# Patient Record
Sex: Female | Born: 1980 | Race: White | Hispanic: No | Marital: Married | Smoking: Former smoker
Health system: Southern US, Community
[De-identification: ages and names within clinical notes are randomized; demographics above are authoritative.]

---
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
## 2006-07-17 HISTORY — PX: DILATION AND CURETTAGE OF UTERUS: SHX78

## 2020-05-25 ENCOUNTER — Ambulatory Visit: Payer: Self-pay | Attending: Internal Medicine

## 2020-05-25 DIAGNOSIS — Z23 Encounter for immunization: Secondary | ICD-10-CM

## 2020-05-25 NOTE — Progress Notes (Signed)
   Covid-19 Vaccination Clinic  Name:  April Juarez    MRN: [id number] DOB: 1981-03-04  05/25/2020  Ms. April Juarez was observed post Covid-19 immunization for 30 minutes based on pre-vaccination screening without incident. She was provided with Vaccine Information Sheet and instruction to access the V-Safe system.   Ms. April Juarez was instructed to call 911 with any severe reactions post vaccine: Marland Kitchen Difficulty breathing  . Swelling of face and throat  . A fast heartbeat  . A bad rash all over body  . Dizziness and weakness

## 2020-07-30 ENCOUNTER — Other Ambulatory Visit: Payer: Self-pay

## 2020-07-30 ENCOUNTER — Encounter: Payer: Self-pay | Admitting: Orthopedic Surgery

## 2020-07-30 ENCOUNTER — Ambulatory Visit (INDEPENDENT_AMBULATORY_CARE_PROVIDER_SITE_OTHER): Payer: 59 | Admitting: Orthopedic Surgery

## 2020-07-30 VITALS — BP 122/78 | HR 71 | Temp 97.3°F | Ht 66.5 in | Wt 154.4 lb

## 2020-07-30 DIAGNOSIS — J302 Other seasonal allergic rhinitis: Secondary | ICD-10-CM | POA: Diagnosis not present

## 2020-07-30 DIAGNOSIS — Z0000 Encounter for general adult medical examination without abnormal findings: Secondary | ICD-10-CM

## 2020-07-30 DIAGNOSIS — G4709 Other insomnia: Secondary | ICD-10-CM | POA: Diagnosis not present

## 2020-07-30 DIAGNOSIS — L299 Pruritus, unspecified: Secondary | ICD-10-CM

## 2020-07-30 DIAGNOSIS — L700 Acne vulgaris: Secondary | ICD-10-CM

## 2020-07-30 DIAGNOSIS — Z6824 Body mass index (BMI) 24.0-24.9, adult: Secondary | ICD-10-CM

## 2020-07-30 NOTE — Progress Notes (Addendum)
Careteam: No care team member to display  Seen by: Windell Moulding, AGNP-C  PLACE OF SERVICE:  [place]  Advanced Directive information    Allergies  Allergen Reactions  . Sulfa Antibiotics Hives    Chief Complaint  April Juarez presents with  . Establish Care    Np to Establish Care. Complains of Acne and bilateral ears itching.     HPI: April Juarez is a 40 y.o. female seen today to establish care at [facility].   Moved to [place] from [place], [place] about 6 months ago. She is a Equities trader at [employer], womens unit. She is married with 4 children.   Ear itching- began a few years ago. Itching only in the canal part of ear. Denies ear drainage or pain. Denies frequent ear infections. Has seen a dermatologist in th past. Dermatology told her they did not know cause. Takes Claritin to help with itch. Also has prescribed clobatesol  steroid cream, only uses when itching is severe. In addition, she uses Benadryl often to help with ears and sleep. Tried Xyzal in past and it made her very drowsy.   Seasonal allergies- states allergies to pollen, molds, and ragweed. Takes Claritin and Benadryl.   Cyctic acne- started 8 years ago after childbirth. 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 Thinks it may be related to food triggers, especially dairy. Acne improves when limiting dairy proucts in her diet. Use to take birth control, but does not wish to take anymore. Uses daily moisturizer, does not know if oil free. Washes face twice daily. Does not wear makeup excepet on eyes. Has tried products with salicylic acid in past, but was too drying on the skin.   Uses ibuprofen for mild back pain or headache. Uses may be 1-3 times monthly.   Periods- still having them. Less regular, about 24-32 days. Lasts 5-7 days. Some bloating and cramps. Has not used birthcontrol in a few years.  PAP smear done last year, negative.  Requesting referral for gynecologist.   Diet- states her diet is high in fruits and vegetables. Eats lean protein. Tries to avoid sugar. Has been cutting calories in an effort to lose weight.   Exercise- does yoga or bar exercises about 3 times a week. 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   Nonsmoker. Quit 15 years ago. Use to smoke 1 pack/day.   Alcohol- drinks about 2-3 drinks a week. Either beer or wine.   No drug use.   No recent hospitalizations or injures.   Received pfizer covid vaccine in early 2021, Moderna booster 05/2020. Flu vaccine 04/2020. 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   Denies depression. States she is a normally anxious person. Denies panic attacks.     Review of Systems:  Review of Systems  Constitutional: Negative for chills, fever, malaise/fatigue and weight loss.  HENT: Positive for nosebleeds. Negative for ear discharge, ear pain, sinus pain and sore throat.        Ear itching  Eyes: Negative for blurred vision and photophobia.  Respiratory: Negative for cough, shortness of breath and wheezing.   Cardiovascular: Negative for chest pain and leg swelling.  Gastrointestinal: Negative for abdominal pain, constipation, heartburn, nausea and vomiting.  Genitourinary: Negative for dysuria, frequency and hematuria.  Musculoskeletal: Positive for back pain. Negative for joint pain and myalgias.  Skin:  Cystic acne  Neurological: Negative for dizziness, weakness and headaches.  Endo/Heme/Allergies: Negative for polydipsia.  Psychiatric/Behavioral: Negative for depression. The April Juarez has insomnia. The April Juarez is not nervous/anxious.     No past medical history on file. Past Surgical History:  Procedure Laterality Date  . DILATION AND CURETTAGE OF UTERUS  2008  . TONSILLECTOMY AND ADENOIDECTOMY  2000   Social History:   reports that she has quit smoking. She has a 3.00 pack-year smoking  history. She has never used smokeless tobacco. She reports current alcohol use. She reports that she does not use drugs.  Family History  Problem Relation Age of Onset  . Macular degeneration Mother   . Dementia Maternal Grandmother   . Cancer Maternal Grandmother   . Stroke Maternal Grandfather   . Heart attack Maternal Grandfather   . Cancer Paternal Grandmother   . Stroke Paternal Grandmother   . Stroke Paternal Grandfather   . Heart attack Paternal Grandfather     Medications: April Juarez's Medications  New Prescriptions   No medications on file  Previous Medications   IBUPROFEN PO    Take by mouth. 600-800 mg 1 to 2 times a week   MULTIPLE VITAMINS-MINERALS (WOMENS MULTIVITAMIN PO)    Take by mouth daily.   OMEGA-3 FATTY ACIDS (FISH OIL PO)    Take by mouth daily.   VITAMIN D, CHOLECALCIFEROL, PO    Take by mouth.  Modified Medications   No medications on file  Discontinued Medications   No medications on file    Physical Exam:  Vitals:   07/30/20 1339  BP: 122/78  Pulse: 71  Temp: (!) 97.3 F (36.3 C)  TempSrc: Skin  SpO2: 98%  Weight: 154 lb 6.4 oz (70 kg)  Height: 5' 6.5" (1.689 m)   Body mass index is 24.55 kg/m. Wt Readings from Last 3 Encounters:  07/30/20 154 lb 6.4 oz (70 kg)    Physical Exam Vitals reviewed.  Constitutional:      General: She is not in acute distress.    Appearance: Normal appearance.  HENT:     Head: Normocephalic.     Right Ear: Tympanic membrane and ear canal normal. There is no impacted cerumen.     Left Ear: Tympanic membrane and ear canal normal. There is no impacted cerumen.     Mouth/Throat:     Pharynx: No oropharyngeal exudate.  Eyes:     General:        Right eye: No discharge.        Left eye: No discharge.     Conjunctiva/sclera: Conjunctivae normal.     Pupils: Pupils are equal, round, and reactive to light.  Neck:     Thyroid: No thyroid mass, thyromegaly or thyroid tenderness.  Cardiovascular:     Rate and  Rhythm: Normal rate and regular rhythm.     Pulses: Normal pulses.     Heart sounds: Normal heart sounds.  Pulmonary:     Effort: Pulmonary effort is normal.     Breath sounds: Normal breath sounds.  Abdominal:     General: Abdomen is flat. Bowel sounds are normal. There is no distension.     Palpations: Abdomen is soft.     Tenderness: There is no abdominal tenderness.  Musculoskeletal:        General: Normal range of motion.     Cervical back: Normal range of motion.     Right lower leg: No edema.     Left lower leg: No  edema.  Lymphadenopathy:     Cervical: No cervical adenopathy.  Skin:    General: Skin is warm and dry.     Capillary Refill: Capillary refill takes less than 2 seconds.       Neurological:     Mental Status: She is alert and oriented to person, place, and time.     Cranial Nerves: No cranial nerve deficit.     Sensory: No sensory deficit.     Motor: No weakness.     Coordination: Coordination normal.     Gait: Gait normal.     Deep Tendon Reflexes: Reflexes normal.  Psychiatric:        Mood and Affect: Mood normal.        Behavior: Behavior normal.     Labs reviewed: Basic Metabolic Panel: No results for input(s): NA, K, CL, CO2, GLUCOSE, BUN, CREATININE, CALCIUM, MG, PHOS, TSH in the last 8760 hours. Liver Function Tests: No results for input(s): AST, ALT, ALKPHOS, BILITOT, PROT, ALBUMIN in the last 8760 hours. No results for input(s): LIPASE, AMYLASE in the last 8760 hours. No results for input(s): AMMONIA in the last 8760 hours. CBC: No results for input(s): WBC, NEUTROABS, HGB, HCT, MCV, PLT in the last 8760 hours. Lipid Panel: No results for input(s): CHOL, HDL, LDLCALC, TRIG, CHOLHDL, LDLDIRECT in the last 8760 hours. TSH: No results for input(s): TSH in the last 8760 hours. A1C: No results found for: HGBA1C   Assessment/Plan 1. Annual physical exam - physical exam unremarkable - CBC with Differential/Platelets; Future - CMP; Future -  Lipid Panel; Future - Ambulatory referral to Gynecology  2. Cystic acne - comedones present around mouth and chin area, skin slightly oily - recommend oil free skin products - recommend trying retinol since salicylic acid was too drying - Ambulatory referral to Dermatology  3. Seasonal allergies - stable at this time, continue Clairtin  4. Itching of ear - ear canal no signs of trauma, erythema, dryness - recommend trying a small amount of cerave lightly in ear canal twice weekly for 2 weeks, continue if helping - recommend trying zyrtec for 2 weeks to see if itching reduces - continue Claritin if Zyrtec unsuccessful - advised to stop taking benadryl daily unless itching is severe  5. Other insomnia - uses benadryl often - advised to try melatonin or lavender essential oils to help sleep - advised to stop using electronic devices 1 hour prior to sleep  6. BMI 24.0-24.9, adult - healthy BMI range - continue to watch calories - continue current exercise regimen   I provided 41 minutes of face-to-face time during this encounter.    Next appt: Visit date not found Hargun Spurling Scherry Ran  [facility] [phone]

## 2020-07-30 NOTE — Patient Instructions (Signed)
Make sure skin products oil free.  Recommend Cerave retinol serum at night (purple label- buy at [employer]) May try Cerave cream in ears 2 times a week May try zyrtec twice a day for ear itching Referral to dermatologist and gyno made      Acne  Acne is a skin problem that causes small, red bumps (pimples) and other skin changes. The skin has tiny holes called pores. Each pore has an oil gland. Acne happens when the pores get blocked. The pores may become red, sore, and swollen. They may also become infected. Acne is common among teenagers. Acne usually goes away over time. What are the causes? This condition may be caused when:  Oil glands get blocked by oil, dead skin cells, and dirt.  Bacteria that live in the oil glands increase in number and cause infection. Acne can start with changes in hormones. These changes can occur:  When children mature into their teens (adolescence).  When women get their period (menstrual cycle).  When women are pregnant. Some things can make acne worse. They include:  Cosmetics and hair products that have oil in them.  Stress.  Diseases that cause changes in hormones.  Some medicines.  Headbands, backpacks, or shoulder pads.  Being near certain oils and chemicals.  Foods that are high in sugars. These include dairy products, sweets, and chocolates. What increases the risk? You are more likely to develop this condition if:  You are a teenager.  You have a family history of acne. What are the signs or symptoms? Symptoms of this condition include:  Small, red bumps (pimples or papules).  Whiteheads.  Blackheads.  Small, pus-filled pimples (pustules).  Big, red pimples or pustules that feel tender. Acne that is very bad can cause:  An abscess. This is an area that has pus.  Cysts. These are hard, painful sacs that have fluid.  Scars. These can happen after large pimples heal. How is this treated? Treatment for this condition  depends on how bad your acne is. It may include:  Creams and lotions. These can: ? Keep the pores of your skin open. ? Prevent infections and swelling.  Medicines that treat infections (antibiotics). These can be put on your skin or taken as pills.  Pills that decrease the amount of oil in your skin.  Birth control pills.  Light or laser treatments.  Shots of medicine into the areas with acne.  Chemicals that cause the skin to peel.  Surgery. Follow these instructions at home: Good skin care is the most important thing you can do to treat your acne. Take care of your skin as told by your doctor. You may be told to do these things:  Wash your skin gently at least two times each day. You should also wash your skin: ? After you exercise. ? Before you go to bed.  Use mild soap.  Use a water-based skin moisturizer after you wash your skin.  Use a sunscreen or sunblock with SPF 30 or greater. This is very important if you are using acne medicines.  Choose cosmetics that will not block your oil glands (are noncomedogenic). Medicines  Take over-the-counter and prescription medicines only as told by your doctor.  If you were prescribed an antibiotic medicine, use it or take it as told by your doctor. Do not stop using the antibiotic even if your acne gets better. General instructions  Keep your hair clean and off your face. Shampoo your hair on a regular basis.  If you have oily hair, you may need to wash it every day.  Avoid wearing tight headbands or hats.  Avoid picking or squeezing your pimples. That can make your acne worse and cause it to scar.  Shave gently. Only shave when you have to.  Keep a food journal. This can help you see if any foods are linked to your acne.  Keep all follow-up visits as told by your doctor. This is important. Contact a doctor if:  Your acne is not better after eight weeks.  Your acne gets worse.  You have a large area of skin that is  red or tender.  You think that you are having side effects from any acne medicine. Summary  Acne is a skin problem that causes pimples. Acne is common among teenagers. Acne usually goes away over time.  Acne starts with changes in your hormones. Other causes include stress, diet, and some medicines.  Follow your doctor's instructions on how to take care of your skin. Good skin care is the most important thing you can do to treat your acne.  Take over-the-counter and prescription medicines only as told by your doctor.  Contact your doctor if you think that you are having side effects from any acne medicine. This information is not intended to replace advice given to you by your health care provider. Make sure you discuss any questions you have with your health care provider. Document Revised: 11/13/2017 Document Reviewed: 11/13/2017 Elsevier Patient Education  [place].

## 2020-08-01 DIAGNOSIS — L299 Pruritus, unspecified: Secondary | ICD-10-CM | POA: Insufficient documentation

## 2020-08-01 DIAGNOSIS — Z6824 Body mass index (BMI) 24.0-24.9, adult: Secondary | ICD-10-CM | POA: Insufficient documentation

## 2020-08-01 DIAGNOSIS — L700 Acne vulgaris: Secondary | ICD-10-CM | POA: Insufficient documentation

## 2020-08-01 DIAGNOSIS — J302 Other seasonal allergic rhinitis: Secondary | ICD-10-CM | POA: Insufficient documentation

## 2020-08-04 NOTE — Addendum Note (Signed)
Addended byWindell Moulding E on: 08/04/2020 08:15 PM   Modules accepted: Level of Service

## 2020-08-13 ENCOUNTER — Other Ambulatory Visit: Payer: 59

## 2020-08-13 ENCOUNTER — Other Ambulatory Visit: Payer: Self-pay

## 2020-08-13 DIAGNOSIS — Z0000 Encounter for general adult medical examination without abnormal findings: Secondary | ICD-10-CM | POA: Diagnosis not present

## 2020-08-14 LAB — CBC WITH DIFFERENTIAL/PLATELET
Absolute Monocytes: 498 cells/uL (ref 200–950)
Basophils Absolute: 42 cells/uL (ref 0–200)
Basophils Relative: 0.7 %
Eosinophils Absolute: 162 cells/uL (ref 15–500)
Eosinophils Relative: 2.7 %
HCT: 37.7 % (ref 35.0–45.0)
Hemoglobin: 12.5 g/dL (ref 11.7–15.5)
Lymphs Abs: 1608 cells/uL (ref 850–3900)
MCH: 29.8 pg (ref 27.0–33.0)
MCHC: 33.2 g/dL (ref 32.0–36.0)
MCV: 89.8 fL (ref 80.0–100.0)
MPV: 12.3 fL (ref 7.5–12.5)
Monocytes Relative: 8.3 %
Neutro Abs: 3690 cells/uL (ref 1500–7800)
Neutrophils Relative %: 61.5 %
Platelets: 223 10*3/uL (ref 140–400)
RBC: 4.2 10*6/uL (ref 3.80–5.10)
RDW: 13.1 % (ref 11.0–15.0)
Total Lymphocyte: 26.8 %
WBC: 6 10*3/uL (ref 3.8–10.8)

## 2020-08-14 LAB — COMPREHENSIVE METABOLIC PANEL
AG Ratio: 2 (calc) (ref 1.0–2.5)
ALT: 11 U/L (ref 6–29)
AST: 14 U/L (ref 10–30)
Albumin: 4.4 g/dL (ref 3.6–5.1)
Alkaline phosphatase (APISO): 33 U/L (ref 31–125)
BUN: 14 mg/dL (ref 7–25)
CO2: 27 mmol/L (ref 20–32)
Calcium: 9.3 mg/dL (ref 8.6–10.2)
Chloride: 106 mmol/L (ref 98–110)
Creat: 0.85 mg/dL (ref 0.50–1.10)
Globulin: 2.2 g/dL (calc) (ref 1.9–3.7)
Glucose, Bld: 85 mg/dL (ref 65–99)
Potassium: 3.9 mmol/L (ref 3.5–5.3)
Sodium: 140 mmol/L (ref 135–146)
Total Bilirubin: 0.5 mg/dL (ref 0.2–1.2)
Total Protein: 6.6 g/dL (ref 6.1–8.1)

## 2020-08-14 LAB — LIPID PANEL
Cholesterol: 115 mg/dL (ref <200)
HDL: 39 mg/dL — ABNORMAL LOW (ref >=50)
LDL Cholesterol (Calc): 64 mg/dL (calc)
Non-HDL Cholesterol (Calc): 76 mg/dL (calc) (ref <130)
Total CHOL/HDL Ratio: 2.9 (calc) (ref <5.0)
Triglycerides: 50 mg/dL (ref <150)

## 2020-09-10 ENCOUNTER — Other Ambulatory Visit: Payer: Self-pay | Admitting: Orthopedic Surgery

## 2020-09-10 ENCOUNTER — Encounter: Payer: Self-pay | Admitting: Orthopedic Surgery

## 2020-09-10 DIAGNOSIS — L700 Acne vulgaris: Secondary | ICD-10-CM

## 2020-09-10 MED ORDER — CLINDAMYCIN PHOSPHATE 1 % EX GEL: Freq: Two times a day (BID) | CUTANEOUS | 0 refills | Status: DC

## 2020-09-13 ENCOUNTER — Telehealth: Payer: Self-pay | Admitting: *Deleted

## 2020-09-13 NOTE — Telephone Encounter (Signed)
Received Prior Authorization for patients' Clindamycin Phosphate 1% Gel.  Prior authorization done through CoverMyMeds.  [employer] is reviewing and determination in 48-72 hours.  Key: [id number] PA Case ID: [id number] Rx #: [id number]

## 2020-09-16 NOTE — Telephone Encounter (Signed)
Received fax from [facility] for Clindamycin was DENIED.  Stated that patient did not meet step therapy rule for the requested drug. Standard step therapy requires that patient have tried preferred options before receiving coverage for this drug.  Approval requires patient to try: generic Cleocin-T 1% gel.   Denial Paperwork placed in Amy Fargo's folder to review.

## 2020-09-17 ENCOUNTER — Encounter: Payer: Self-pay | Admitting: Orthopedic Surgery

## 2020-09-17 ENCOUNTER — Other Ambulatory Visit: Payer: Self-pay | Admitting: Orthopedic Surgery

## 2020-09-17 DIAGNOSIS — L700 Acne vulgaris: Secondary | ICD-10-CM

## 2020-09-17 MED ORDER — CLINDAMYCIN PHOSPHATE 1 % EX LOTN: TOPICAL_LOTION | Freq: Two times a day (BID) | CUTANEOUS | 0 refills | Status: DC

## 2020-12-08 ENCOUNTER — Encounter: Payer: Self-pay | Admitting: Orthopedic Surgery

## 2020-12-30 ENCOUNTER — Ambulatory Visit: Payer: Medicaid Other | Admitting: Physician Assistant

## 2021-01-06 ENCOUNTER — Encounter: Payer: Self-pay | Admitting: Orthopedic Surgery

## 2021-01-06 ENCOUNTER — Ambulatory Visit (INDEPENDENT_AMBULATORY_CARE_PROVIDER_SITE_OTHER): Payer: 59 | Admitting: Orthopedic Surgery

## 2021-01-06 ENCOUNTER — Other Ambulatory Visit: Payer: Self-pay

## 2021-01-06 VITALS — BP 118/64 | HR 66 | Temp 97.1°F | Ht 66.5 in | Wt 146.4 lb

## 2021-01-06 DIAGNOSIS — F419 Anxiety disorder, unspecified: Secondary | ICD-10-CM

## 2021-01-06 DIAGNOSIS — F321 Major depressive disorder, single episode, moderate: Secondary | ICD-10-CM | POA: Diagnosis not present

## 2021-01-06 DIAGNOSIS — L853 Xerosis cutis: Secondary | ICD-10-CM

## 2021-01-06 MED ORDER — TRIAMCINOLONE ACETONIDE 0.1 % EX CREA: 1.0000 "application " | TOPICAL_CREAM | Freq: Two times a day (BID) | CUTANEOUS | 0 refills | Status: DC

## 2021-01-06 MED ORDER — BUPROPION HCL ER (XL) 150 MG PO TB24: 150.0000 mg | ORAL_TABLET | Freq: Every day | ORAL | 3 refills | Status: DC

## 2021-01-06 NOTE — Patient Instructions (Signed)
Dr. Johney Maine - daily alpha-beta wipes/ [facility]

## 2021-01-06 NOTE — Progress Notes (Signed)
Careteam: Patient Care Team: Yvonna Alanis, NP as PCP - General (Adult Health Nurse Practitioner)  Seen by: Windell Moulding, AGNP-C  PLACE OF SERVICE:  [place] Directive information    Allergies  Allergen Reactions   Sulfa Antibiotics Hives    Chief Complaint  Patient presents with   Acute Visit    Patient presents today for possible anxiety and depression for a while but seems to have progressed over the last 3-4 months. She reports been on antidepressant in high school.     HPI: Patient is a 40 y.o. female seen today for acute visit for depression.   She has dealt with depression on/off for years. In the past year, she has had many changes like moving to a new town and starting a new job. 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 Started seeing a therapist a few months ago. She has been told she has a combination of anxiety with depression. Her main complaint is trying to focus, she feels her increased anxiety has made it hard for her to study. Sleeping well. Denies plan to harm self.   As a teenager she was on prozac and zoloft. She did well with zoloft. Asking to try Wellbutrin today per therapist recommendation.     Review of Systems:  Review of Systems  Constitutional:  Negative for chills, fever, malaise/fatigue and weight loss.  Respiratory:  Negative for cough, shortness of breath and wheezing.   Cardiovascular:  Negative for chest pain, palpitations and leg swelling.  Psychiatric/Behavioral:  Positive for depression. Negative for suicidal ideas. The patient is nervous/anxious. The patient does not have insomnia.    History reviewed. No pertinent past medical history. Past Surgical History:  Procedure Laterality Date   DILATION AND CURETTAGE OF UTERUS  2008   TONSILLECTOMY AND ADENOIDECTOMY  2000   Social History:   reports that she has quit smoking. She has a 3.00 pack-year smoking  history. She has never used smokeless tobacco. She reports current alcohol use. She reports that she does not use drugs.  Family History  Problem Relation Age of Onset   Macular degeneration Mother    Dementia Maternal Grandmother    Cancer Maternal Grandmother    Stroke Maternal Grandfather    Heart attack Maternal Grandfather    Cancer Paternal Grandmother    Stroke Paternal Grandmother    Stroke Paternal Grandfather    Heart attack Paternal Grandfather     Medications: Patient's Medications  New Prescriptions   No medications on file  Previous Medications   CLINDAMYCIN (CLEOCIN-T) 1 % LOTION    Apply topically 2 (two) times daily. Apply to cystic acne   IBUPROFEN PO    Take by mouth. 600-800 mg 1 to 2 times a week   MULTIPLE VITAMINS-MINERALS (WOMENS MULTIVITAMIN PO)    Take by mouth daily.   OMEGA-3 FATTY ACIDS (FISH OIL PO)    Take by mouth daily.   VITAMIN D, CHOLECALCIFEROL, PO    Take by mouth.  Modified Medications   No medications on file  Discontinued Medications   CLINDAMYCIN (CLINDAGEL) 1 % GEL    Apply topically 2 (two) times daily. Apply to cystic ance    Physical Exam:  Vitals:   01/06/21 0937  BP: 118/64  Pulse: 66  Temp: (!) 97.1 F (36.2 C)  TempSrc: Temporal  SpO2: 98%  Weight: 146 lb 6.4 oz (66.4 kg)  Height: 5' 6.5" (1.689 m)   Body mass index is 23.28 kg/m. Wt Readings from Last 3 Encounters:  01/06/21 146 lb 6.4 oz (66.4 kg)  07/30/20 154 lb 6.4 oz (70 kg)    Physical Exam Vitals reviewed.  Constitutional:      Appearance: Normal appearance.  HENT:     Head: Normocephalic.     Ears:     Comments: Dry flaking skin behind ears.  Cardiovascular:     Rate and Rhythm: Normal rate and regular rhythm.     Pulses: Normal pulses.     Heart sounds: Normal heart sounds.  Pulmonary:     Effort: Pulmonary effort is normal.     Breath sounds: Normal breath sounds.  Skin:    General: Skin is warm and dry.     Capillary Refill: Capillary  refill takes less than 2 seconds.  Neurological:     General: No focal deficit present.     Mental Status: She is alert and oriented to person, place, and time.  Psychiatric:        Mood and Affect: Mood normal.        Behavior: Behavior normal.    Labs reviewed: Basic Metabolic Panel: Recent Labs    08/13/20 0923  NA 140  K 3.9  CL 106  CO2 27  GLUCOSE 85  BUN 14  CREATININE 0.85  CALCIUM 9.3   Liver Function Tests: Recent Labs    08/13/20 0923  AST 14  ALT 11  BILITOT 0.5  PROT 6.6   No results for input(s): LIPASE, AMYLASE in the last 8760 hours. No results for input(s): AMMONIA in the last 8760 hours. CBC: Recent Labs    08/13/20 0923  WBC 6.0  NEUTROABS 3,690  HGB 12.5  HCT 37.7  MCV 89.8  PLT 223   Lipid Panel: Recent Labs    08/13/20 0923  CHOL 115  HDL 39*  LDLCALC 64  TRIG 50  CHOLHDL 2.9   TSH: No results for input(s): TSH in the last 8760 hours. A1C: No results found for: HGBA1C   Assessment/Plan 1. Depression, major, single episode, moderate (HCC) - history of depression as teenager - she has had many changes within past year, she is having trouble dealing with change - cont therapy visits - will consider Zoloft if she does not like Wellbutrin - buPROPion (WELLBUTRIN XL) 150 MG 24 hr tablet; Take 1 tablet (150 mg total) by mouth daily.  Dispense: 30 tablet; Refill: 3  2. Dry skin -dry skin behind ears - triamcinolone cream (KENALOG) 0.1 %; Apply 1 application topically 2 (two) times daily.  Dispense: 30 g; Refill: 0  3. Anxiety - suspect due to stressful work life and starting school - will try wellbutrin, recommend restarting zoloft if she does not tolerate medication  Total time: 25 minutes. Greater than 50% of total time spent doing patient education and medication management.   Next appt: none Gillian Meeuwsen [place], [facility] [phone]

## 2021-03-09 ENCOUNTER — Encounter: Payer: Self-pay | Admitting: Orthopedic Surgery

## 2021-03-10 ENCOUNTER — Other Ambulatory Visit: Payer: Self-pay | Admitting: Orthopedic Surgery

## 2021-03-10 DIAGNOSIS — L700 Acne vulgaris: Secondary | ICD-10-CM

## 2021-03-10 DIAGNOSIS — L853 Xerosis cutis: Secondary | ICD-10-CM

## 2021-04-06 ENCOUNTER — Telehealth: Payer: Self-pay | Admitting: *Deleted

## 2021-04-06 ENCOUNTER — Other Ambulatory Visit: Payer: Self-pay | Admitting: Orthopedic Surgery

## 2021-04-06 DIAGNOSIS — F321 Major depressive disorder, single episode, moderate: Secondary | ICD-10-CM

## 2021-04-06 MED ORDER — BUPROPION HCL ER (XL) 150 MG PO TB24
150.0000 mg | ORAL_TABLET | Freq: Every day | ORAL | 0 refills | Status: DC
Start: 2021-04-06 — End: 2021-04-21

## 2021-04-06 NOTE — Telephone Encounter (Signed)
Patient Comments:  I have been taking Wellbutrin as prescribed, and am out of refills. I am interested in discussing weaning-off and/or trying a different medication. I feel that my real issue is anxiety versus depression. I have been attending virtual counseling visits as well.     Message was forwarded to me from [employer]. Patient had made an appointment for 04/21/2021 with Amy.   I called patient to see if she needed a sooner appointment with her concerns and she stated that she did not. Stated that she was stable and October 6 was fine with her. Stated that she will discuss further at that time.   I informed patient to call us if she needed anything sooner. She agreed.    FYI

## 2021-04-06 NOTE — Telephone Encounter (Signed)
New prescription sent into pharmacy. I would cut dose in half for one week, then discontinue when you are ready.

## 2021-04-07 NOTE — Telephone Encounter (Signed)
LMOM to return call.

## 2021-04-08 NOTE — Telephone Encounter (Signed)
Patient notified and agreed.  Patient changed her appointment on 10/6 to In Person appointment.

## 2021-04-20 DIAGNOSIS — M9902 Segmental and somatic dysfunction of thoracic region: Secondary | ICD-10-CM | POA: Diagnosis not present

## 2021-04-20 DIAGNOSIS — M9901 Segmental and somatic dysfunction of cervical region: Secondary | ICD-10-CM | POA: Diagnosis not present

## 2021-04-20 DIAGNOSIS — M9903 Segmental and somatic dysfunction of lumbar region: Secondary | ICD-10-CM | POA: Diagnosis not present

## 2021-04-20 DIAGNOSIS — M546 Pain in thoracic spine: Secondary | ICD-10-CM | POA: Diagnosis not present

## 2021-04-21 ENCOUNTER — Encounter: Payer: Self-pay | Admitting: Orthopedic Surgery

## 2021-04-21 ENCOUNTER — Other Ambulatory Visit: Payer: Self-pay

## 2021-04-21 ENCOUNTER — Ambulatory Visit (INDEPENDENT_AMBULATORY_CARE_PROVIDER_SITE_OTHER): Payer: 59 | Admitting: Orthopedic Surgery

## 2021-04-21 VITALS — BP 118/62 | HR 74 | Temp 97.7°F | Ht 66.5 in | Wt 148.2 lb

## 2021-04-21 DIAGNOSIS — J302 Other seasonal allergic rhinitis: Secondary | ICD-10-CM | POA: Diagnosis not present

## 2021-04-21 DIAGNOSIS — F321 Major depressive disorder, single episode, moderate: Secondary | ICD-10-CM | POA: Diagnosis not present

## 2021-04-21 DIAGNOSIS — F419 Anxiety disorder, unspecified: Secondary | ICD-10-CM

## 2021-04-21 DIAGNOSIS — H6121 Impacted cerumen, right ear: Secondary | ICD-10-CM

## 2021-04-21 MED ORDER — BUPROPION HCL ER (SR) 100 MG PO TB12: 100.0000 mg | ORAL_TABLET | Freq: Every day | ORAL | 0 refills | Status: DC

## 2021-04-21 NOTE — Progress Notes (Signed)
Careteam: Patient Care Team: Yvonna Alanis, NP as PCP - General (Adult Health Nurse Practitioner)  Seen by: Windell Moulding, AGNP-C  PLACE OF SERVICE:  [place] Directive information    Allergies  Allergen Reactions   Sulfa Antibiotics Hives    Chief Complaint  Patient presents with   Medical Management of Chronic Issues    Patient presents today to discuss getting off of Wellbutrin and possible anxiety.   Quality Metric Gaps    HIV, Hep C screening, pap smear, TDAP and flu vaccines.   Acute Visit    Patient reports a referral to ENT for ear fullness she has for a while.     HPI: Patient is a 40 y.o. female seen today for medical management of chronic conditions.   Would like to wean off Wellbutrin. She started medication in June for depression but believes it made her anxiety worse. Anxiety related to busy work, school and family life. No recent panic attacks. Describes herself as a Careers adviser. She has been exercising more to help calm her down. We have discussed anxiety/stress reducing techniques. She is interested in trying meditation as well. She has also started scheduling massages. She do some virtual therapy sessions, but did not care for therapist.   She has also been having right ear fullness. Unsure if her hearing is effected. Increased with nervousness. She uses Qtips. Recently used debrox drops, unsure if it helped. She does have seasonal allergies. Takes Claritin daily.     Review of Systems:  Review of Systems  Constitutional:  Negative for chills, fever, malaise/fatigue and weight loss.  HENT:         Ear fullness  Eyes: Negative.   Respiratory:  Negative for cough, shortness of breath and wheezing.   Cardiovascular:  Negative for chest pain and leg swelling.  Gastrointestinal: Negative.   Genitourinary: Negative.   Musculoskeletal: Negative.   Skin: Negative.   Neurological: Negative.   Psychiatric/Behavioral:  Negative for depression.  The patient is nervous/anxious. The patient does not have insomnia.    History reviewed. No pertinent past medical history. Past Surgical History:  Procedure Laterality Date   DILATION AND CURETTAGE OF UTERUS  2008   TONSILLECTOMY AND ADENOIDECTOMY  2000   Social History:   reports that she has quit smoking. Her smoking use included cigarettes. She has a 3.00 pack-year smoking history. She has never used smokeless tobacco. She reports current alcohol use. She reports that she does not use drugs.  Family History  Problem Relation Age of Onset   Macular degeneration Mother    Dementia Maternal Grandmother    Cancer Maternal Grandmother    Stroke Maternal Grandfather    Heart attack Maternal Grandfather    Cancer Paternal Grandmother    Stroke Paternal Grandmother    Stroke Paternal Grandfather    Heart attack Paternal Grandfather     Medications: Patient's Medications  New Prescriptions   No medications on file  Previous Medications   BUPROPION (WELLBUTRIN XL) 150 MG 24 HR TABLET    Take 1 tablet (150 mg total) by mouth daily.   CLINDAMYCIN (CLEOCIN-T) 1 % LOTION    Apply topically 2 (two) times daily. Apply to cystic acne   IBUPROFEN PO    Take by mouth. 600-800 mg 1 to 2 times a week   MULTIPLE VITAMINS-MINERALS (WOMENS MULTIVITAMIN PO)    Take by mouth daily.   OMEGA-3 FATTY ACIDS (FISH OIL PO)    Take by  mouth daily.   TRIAMCINOLONE CREAM (KENALOG) 0.1 %    Apply 1 application topically 2 (two) times daily.   VITAMIN D, CHOLECALCIFEROL, PO    Take by mouth.  Modified Medications   No medications on file  Discontinued Medications   No medications on file    Physical Exam:  Vitals:   04/21/21 1512  BP: 118/62  Pulse: 74  Temp: 97.7 F (36.5 C)  SpO2: 98%  Weight: 148 lb 3.2 oz (67.2 kg)  Height: 5' 6.5" (1.689 m)   Body mass index is 23.56 kg/m. Wt Readings from Last 3 Encounters:  04/21/21 148 lb 3.2 oz (67.2 kg)  01/06/21 146 lb 6.4 oz (66.4 kg)  07/30/20  154 lb 6.4 oz (70 kg)    Physical Exam Vitals reviewed.  Constitutional:      General: She is not in acute distress. HENT:     Head: Normocephalic.     Right Ear: Tympanic membrane and ear canal normal. There is no impacted cerumen.     Left Ear: There is no impacted cerumen.     Ears:     Comments: Small piece of wax near TM.     Nose: Nose normal.     Mouth/Throat:     Mouth: Mucous membranes are moist.  Eyes:     General:        Right eye: No discharge.        Left eye: No discharge.  Neck:     Vascular: No carotid bruit.  Cardiovascular:     Rate and Rhythm: Normal rate and regular rhythm.     Pulses: Normal pulses.     Heart sounds: Normal heart sounds. No murmur heard. Pulmonary:     Effort: Pulmonary effort is normal. No respiratory distress.     Breath sounds: Normal breath sounds. No wheezing.  Abdominal:     General: Bowel sounds are normal. There is no distension.     Palpations: Abdomen is soft.     Tenderness: There is no abdominal tenderness.  Musculoskeletal:     Cervical back: Normal range of motion.     Right lower leg: No edema.     Left lower leg: No edema.  Lymphadenopathy:     Cervical: No cervical adenopathy.  Skin:    General: Skin is warm and dry.     Capillary Refill: Capillary refill takes less than 2 seconds.  Neurological:     General: No focal deficit present.     Mental Status: She is alert and oriented to person, place, and time.  Psychiatric:        Mood and Affect: Mood normal.        Behavior: Behavior normal.    Labs reviewed: Basic Metabolic Panel: Recent Labs    08/13/20 0923  NA 140  K 3.9  CL 106  CO2 27  GLUCOSE 85  BUN 14  CREATININE 0.85  CALCIUM 9.3   Liver Function Tests: Recent Labs    08/13/20 0923  AST 14  ALT 11  BILITOT 0.5  PROT 6.6   No results for input(s): LIPASE, AMYLASE in the last 8760 hours. No results for input(s): AMMONIA in the last 8760 hours. CBC: Recent Labs    08/13/20 0923   WBC 6.0  NEUTROABS 3,690  HGB 12.5  HCT 37.7  MCV 89.8  PLT 223   Lipid Panel: Recent Labs    08/13/20 0923  CHOL 115  HDL 39*  LDLCALC 64  TRIG 50  CHOLHDL 2.9   TSH: No results for input(s): TSH in the last 8760 hours. A1C: No results found for: HGBA1C   Assessment/Plan 1. Depression, major, single episode, moderate ([place]) - resolved - tried therapy over the summer - plan to wean off Wellbutrin in next week - buPROPion ER (WELLBUTRIN SR) 100 MG 12 hr tablet; Take 1 tablet (100 mg total) by mouth daily for 7 days.  Dispense: 7 tablet; Refill: 0  2. Anxiety - increased with Wellbutrin - plan to wean in next week - discussed stress and anxiety reducing interventions - recommend exercise, meditation and having time to self  3. Seasonal allergies - suspect ear fullness related to allergies - recommend stopping Claritin for 2 weeks and trying zyrtec daily  4. Cerumen debris on tympanic membrane of right ear - small fragment of wax on TM - recommend debrox 5 gtts- place 30 min before showering, then flush ear in shower - avoid Q-tips  Total time: 31 minutes. Greater than 50% of total time spent doing patient education regarding stress reduction, anxiety management and medication management.   Next appt: 07/28/2021  Windell Moulding, [facility] [phone]

## 2021-04-21 NOTE — Patient Instructions (Addendum)
Meditation- try to find a relaxing video on youtube- try to do about 10-20 minutes daily  Continue to enjoy the outdoors   Debrox- apply 5 gtts to right ear, please let sit for 30 minutes and them flush ear in shower  Please change from Claritin to Zyrtec for 2 weeks.

## 2021-05-25 ENCOUNTER — Encounter: Payer: Self-pay | Admitting: Orthopedic Surgery

## 2021-07-28 ENCOUNTER — Ambulatory Visit: Payer: 59 | Admitting: Orthopedic Surgery

## 2021-08-04 ENCOUNTER — Other Ambulatory Visit: Payer: Self-pay

## 2021-08-04 ENCOUNTER — Ambulatory Visit (INDEPENDENT_AMBULATORY_CARE_PROVIDER_SITE_OTHER): Payer: 59 | Admitting: Orthopedic Surgery

## 2021-08-04 ENCOUNTER — Encounter: Payer: Self-pay | Admitting: Orthopedic Surgery

## 2021-08-04 VITALS — BP 122/78 | HR 78 | Temp 97.1°F | Ht 66.5 in | Wt 147.8 lb

## 2021-08-04 DIAGNOSIS — J302 Other seasonal allergic rhinitis: Secondary | ICD-10-CM | POA: Diagnosis not present

## 2021-08-04 DIAGNOSIS — Z0000 Encounter for general adult medical examination without abnormal findings: Secondary | ICD-10-CM

## 2021-08-04 DIAGNOSIS — Z1159 Encounter for screening for other viral diseases: Secondary | ICD-10-CM

## 2021-08-04 DIAGNOSIS — Z6824 Body mass index (BMI) 24.0-24.9, adult: Secondary | ICD-10-CM

## 2021-08-04 DIAGNOSIS — R197 Diarrhea, unspecified: Secondary | ICD-10-CM

## 2021-08-04 DIAGNOSIS — F321 Major depressive disorder, single episode, moderate: Secondary | ICD-10-CM

## 2021-08-04 DIAGNOSIS — L700 Acne vulgaris: Secondary | ICD-10-CM | POA: Diagnosis not present

## 2021-08-04 DIAGNOSIS — F419 Anxiety disorder, unspecified: Secondary | ICD-10-CM | POA: Diagnosis not present

## 2021-08-04 NOTE — Progress Notes (Signed)
Careteam: Patient Care Team: Yvonna Alanis, NP as PCP - General (Adult Health Nurse Practitioner)  Seen by: Windell Moulding, AGNP-C  PLACE OF SERVICE:  [place] Directive information    Allergies  Allergen Reactions   Sulfa Antibiotics Hives    No chief complaint on file.   HPI: Patient is a 41 y.o. female seen today for annual physical exam.   Depression- unsuccessful trial of Wellbutrin last year, she is not on any medication at this time, she is exercising a little more, not seeing counselor, denies mood changes, reports feeling much better since stoping Wellbutrin  Anxiety- no recent panic attacks, school is less stressful  Diarrhea- symptoms began in November, she did a food diary and believes dairy contributed to symptoms, she was also taking magnesium to aide with sleep, she eliminated dairy/stopped magnesium/ added fiber and probiotic, averaging about 1 episode a week, last episode yesterday- 4 loose stools, denies fever/abdominal pain, admits to some bloating   Acne- improved with Differin retinol  Seasonal allergies-alternated between Claritin and zyrtec, tried Xyzal in the past and reports feeling "drugged"  Has not scheduled annual gynecology appointment. LMP a few days ago, reports normal. Does not use contraception, husband had vasectomy.   HIV screening in 2017 during last pregnancy.   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 Will look up tetanus vaccine.          Review of Systems:  Review of Systems  Constitutional:  Negative for chills, fever, malaise/fatigue and weight loss.  HENT:  Positive for congestion.   Eyes:  Negative for redness.  Respiratory:  Negative for cough, shortness of breath and wheezing.   Cardiovascular:  Negative for chest pain and leg swelling.  Gastrointestinal:  Positive for diarrhea. Negative for abdominal pain, blood in stool, constipation, heartburn, nausea and  vomiting.  Genitourinary: Negative.   Musculoskeletal: Negative.   Skin: Negative.   Neurological: Negative.   Endo/Heme/Allergies:  Positive for environmental allergies. Negative for polydipsia.  Psychiatric/Behavioral:  Positive for depression. Negative for substance abuse and suicidal ideas. The patient is nervous/anxious. The patient does not have insomnia.    No past medical history on file. Past Surgical History:  Procedure Laterality Date   DILATION AND CURETTAGE OF UTERUS  2008   TONSILLECTOMY AND ADENOIDECTOMY  2000   Social History:   reports that she has quit smoking. Her smoking use included cigarettes. She has a 3.00 pack-year smoking history. She has never used smokeless tobacco. She reports current alcohol use. She reports that she does not use drugs.  Family History  Problem Relation Age of Onset   Macular degeneration Mother    Dementia Maternal Grandmother    Cancer Maternal Grandmother    Stroke Maternal Grandfather    Heart attack Maternal Grandfather    Cancer Paternal Grandmother    Stroke Paternal Grandmother    Stroke Paternal Grandfather    Heart attack Paternal Grandfather     Medications: Patient's Medications  New Prescriptions   No medications on file  Previous Medications   BUPROPION ER (WELLBUTRIN SR) 100 MG 12 HR TABLET    Take 1 tablet (100 mg total) by mouth daily for 7 days.   CLINDAMYCIN (CLEOCIN-T) 1 % LOTION    Apply topically 2 (two) times daily. Apply to cystic acne   IBUPROFEN PO    Take by mouth. 600-800 mg 1 to 2 times a week  MULTIPLE VITAMINS-MINERALS (WOMENS MULTIVITAMIN PO)    Take by mouth daily.   OMEGA-3 FATTY ACIDS (FISH OIL PO)    Take by mouth daily.   TRIAMCINOLONE CREAM (KENALOG) 0.1 %    Apply 1 application topically 2 (two) times daily.   VITAMIN D, CHOLECALCIFEROL, PO    Take by mouth.  Modified Medications   No medications on file  Discontinued Medications   No medications on file    Physical Exam:  There  were no vitals filed for this visit. There is no height or weight on file to calculate BMI. Wt Readings from Last 3 Encounters:  04/21/21 148 lb 3.2 oz (67.2 kg)  01/06/21 146 lb 6.4 oz (66.4 kg)  07/30/20 154 lb 6.4 oz (70 kg)    Physical Exam Vitals reviewed.  Constitutional:      General: She is not in acute distress. HENT:     Head: Normocephalic.     Nose: Nose normal.     Mouth/Throat:     Mouth: Mucous membranes are moist.  Eyes:     General:        Right eye: No discharge.        Left eye: No discharge.  Neck:     Thyroid: No thyroid mass or thyromegaly.     Vascular: No carotid bruit.  Cardiovascular:     Rate and Rhythm: Normal rate and regular rhythm.     Pulses: Normal pulses.     Heart sounds: Normal heart sounds. No murmur heard. Pulmonary:     Effort: Pulmonary effort is normal. No respiratory distress.     Breath sounds: Normal breath sounds. No wheezing.  Abdominal:     General: Bowel sounds are normal. There is no distension.     Palpations: Abdomen is soft.     Tenderness: There is no abdominal tenderness.  Musculoskeletal:     Cervical back: Normal range of motion.     Right lower leg: No edema.     Left lower leg: No edema.  Lymphadenopathy:     Cervical: No cervical adenopathy.  Skin:    General: Skin is warm and dry.     Capillary Refill: Capillary refill takes less than 2 seconds.  Neurological:     General: No focal deficit present.     Mental Status: She is alert and oriented to person, place, and time.  Psychiatric:        Mood and Affect: Mood normal.        Behavior: Behavior normal.    Labs reviewed: Basic Metabolic Panel: Recent Labs    08/13/20 0923  NA 140  K 3.9  CL 106  CO2 27  GLUCOSE 85  BUN 14  CREATININE 0.85  CALCIUM 9.3   Liver Function Tests: Recent Labs    08/13/20 0923  AST 14  ALT 11  BILITOT 0.5  PROT 6.6   No results for input(s): LIPASE, AMYLASE in the last 8760 hours. No results for input(s):  AMMONIA in the last 8760 hours. CBC: Recent Labs    08/13/20 0923  WBC 6.0  NEUTROABS 3,690  HGB 12.5  HCT 37.7  MCV 89.8  PLT 223   Lipid Panel: Recent Labs    08/13/20 0923  CHOL 115  HDL 39*  LDLCALC 64  TRIG 50  CHOLHDL 2.9   TSH: No results for input(s): TSH in the last 8760 hours. A1C: No results found for: HGBA1C   Assessment/Plan 1. Annual physical exam -  LDL 69 2022- plan to check 2024  2. Diarrhea, unspecified type - onset 05/2021, loose stools 1x/week - exam unremarkable - cont to limit dairy in diet - cont probiotic daily - CBC with Differential/Platelet - Basic Metabolic Panel  3. Encounter for hepatitis C screening test for low risk patient - Hep C Antibody  4. Depression, major, single episode, moderate ([place]) - unsuccessful trial of Wellbutrin- increased anxiety - doing well with exercise/sunlight  5. Anxiety - no recent panic attacks - stressors: school and work- less stressful at this tim  6. Seasonal allergies - stable with Claritin  7. Cystic acne - improved with Differin gel and retinol  8. BMI 24.0-24.9, adult - at goal - cont to limit calories - recommend exercise 150 min/week  Total time: 31 minutes. Greater than 50% of total time spent doing patient education on health maintenance, diarrhea, depression/anxiety.    Next appt: none Dailey Buccheri [place], [facility] [phone]

## 2021-08-05 LAB — CBC WITH DIFFERENTIAL/PLATELET
Absolute Monocytes: 686 cells/uL (ref 200–950)
Basophils Absolute: 51 cells/uL (ref 0–200)
Basophils Relative: 0.7 %
Eosinophils Absolute: 277 cells/uL (ref 15–500)
Eosinophils Relative: 3.8 %
HCT: 39.9 % (ref 35.0–45.0)
Hemoglobin: 12.9 g/dL (ref 11.7–15.5)
Lymphs Abs: 1095 cells/uL (ref 850–3900)
MCH: 28.7 pg (ref 27.0–33.0)
MCHC: 32.3 g/dL (ref 32.0–36.0)
MCV: 88.9 fL (ref 80.0–100.0)
MPV: 11.8 fL (ref 7.5–12.5)
Monocytes Relative: 9.4 %
Neutro Abs: 5190 cells/uL (ref 1500–7800)
Neutrophils Relative %: 71.1 %
Platelets: 231 10*3/uL (ref 140–400)
RBC: 4.49 10*6/uL (ref 3.80–5.10)
RDW: 12.7 % (ref 11.0–15.0)
Total Lymphocyte: 15 %
WBC: 7.3 10*3/uL (ref 3.8–10.8)

## 2021-08-05 LAB — BASIC METABOLIC PANEL
BUN: 14 mg/dL (ref 7–25)
CO2: 32 mmol/L (ref 20–32)
Calcium: 9.7 mg/dL (ref 8.6–10.2)
Chloride: 105 mmol/L (ref 98–110)
Creat: 0.89 mg/dL (ref 0.50–0.99)
Glucose, Bld: 58 mg/dL — ABNORMAL LOW (ref 65–99)
Potassium: 4 mmol/L (ref 3.5–5.3)
Sodium: 140 mmol/L (ref 135–146)

## 2021-08-05 LAB — HEPATITIS C ANTIBODY
Hepatitis C Ab: NONREACTIVE
SIGNAL TO CUT-OFF: 0.11 (ref <1.00)

## 2021-09-07 ENCOUNTER — Encounter: Payer: Self-pay | Admitting: Orthopedic Surgery

## 2021-09-08 ENCOUNTER — Other Ambulatory Visit: Payer: Self-pay | Admitting: Orthopedic Surgery

## 2021-09-18 DIAGNOSIS — J020 Streptococcal pharyngitis: Secondary | ICD-10-CM | POA: Diagnosis not present

## 2021-09-18 DIAGNOSIS — Z20822 Contact with and (suspected) exposure to covid-19: Secondary | ICD-10-CM | POA: Diagnosis not present

## 2021-09-18 DIAGNOSIS — J040 Acute laryngitis: Secondary | ICD-10-CM | POA: Diagnosis not present

## 2021-09-18 DIAGNOSIS — R059 Cough, unspecified: Secondary | ICD-10-CM | POA: Diagnosis not present

## 2021-09-19 ENCOUNTER — Ambulatory Visit (INDEPENDENT_AMBULATORY_CARE_PROVIDER_SITE_OTHER): Payer: 59 | Admitting: Dermatology

## 2021-09-19 ENCOUNTER — Other Ambulatory Visit: Payer: Self-pay

## 2021-09-19 DIAGNOSIS — L813 Cafe au lait spots: Secondary | ICD-10-CM

## 2021-09-19 DIAGNOSIS — Z1283 Encounter for screening for malignant neoplasm of skin: Secondary | ICD-10-CM | POA: Diagnosis not present

## 2021-09-19 DIAGNOSIS — L219 Seborrheic dermatitis, unspecified: Secondary | ICD-10-CM | POA: Diagnosis not present

## 2021-09-19 DIAGNOSIS — L700 Acne vulgaris: Secondary | ICD-10-CM

## 2021-09-19 DIAGNOSIS — D229 Melanocytic nevi, unspecified: Secondary | ICD-10-CM

## 2021-09-19 DIAGNOSIS — D225 Melanocytic nevi of trunk: Secondary | ICD-10-CM

## 2021-09-19 MED ORDER — AMZEEQ 4 % EX FOAM: 1.0000 "application " | Freq: Every day | CUTANEOUS | 5 refills | Status: DC

## 2021-09-19 NOTE — Patient Instructions (Addendum)
Otc tarsum shampoo  ?Hydrocortisone for ear, or lotrimin ?If you do not hear from [facility] by the end of the day give them a call tomorrow @ [phone] ?

## 2021-09-22 DIAGNOSIS — Z01419 Encounter for gynecological examination (general) (routine) without abnormal findings: Secondary | ICD-10-CM | POA: Diagnosis not present

## 2021-09-22 DIAGNOSIS — Z1151 Encounter for screening for human papillomavirus (HPV): Secondary | ICD-10-CM | POA: Diagnosis not present

## 2021-09-22 DIAGNOSIS — Z1389 Encounter for screening for other disorder: Secondary | ICD-10-CM | POA: Diagnosis not present

## 2021-09-22 DIAGNOSIS — Z124 Encounter for screening for malignant neoplasm of cervix: Secondary | ICD-10-CM | POA: Diagnosis not present

## 2021-09-22 LAB — HM PAP SMEAR: HM Pap smear: UNDETERMINED

## 2021-09-22 LAB — RESULTS CONSOLE HPV: CHL HPV: NEGATIVE

## 2021-09-28 DIAGNOSIS — Z1231 Encounter for screening mammogram for malignant neoplasm of breast: Secondary | ICD-10-CM | POA: Diagnosis not present

## 2021-09-30 ENCOUNTER — Encounter: Payer: Self-pay | Admitting: Dermatology

## 2021-09-30 NOTE — Progress Notes (Signed)
? ?  New Patient ?  ?Subjective  ?April Juarez is a 41 y.o. female who presents for the following: Annual Exam (Pt here for annual. Pt has family hx of non melanoma skin cancer. Cystic acne, B/L Ears very itchy. Pt has used TAC cream and states it made her ears worse. Pt has some tightness and itchiness to the scalp.). ? ?Skin check, itching and scale scalp, ears.  Cystic acne. ?Location:  ?Duration:  ?Quality:  ?Associated Signs/Symptoms: ?Modifying Factors:  ?Severity:  ?Timing: ?Context:  ? ? ?The following portions of the chart were reviewed this encounter and updated as appropriate:  Tobacco  Allergies  Meds  Problems  Med Hx  Surg Hx  Fam Hx   ?  ? ?Objective  ?Well appearing patient in no apparent distress; mood and affect are within normal limits. ?No atypical pigmented lesions or nonmelanoma skin cancer. ? ?Right Thigh - Posterior ?2 cm monochrome slightly irregular tan macule; no dermoscopic atypia ? ?Mid Back, Right Inframammary Fold ?Raised flesh-colored moles with dermoscopic atypia ? ?Right Inferior Crus of Antihelix, Scalp ?Moderate scalp scale with history of itching.  Mild distal ear canal scaling inflammation, left more than right. ? ?Head - Anterior (Face) ?Perifolliular fibrosis upper back.  Cystic acne with potential scarring face. ? ? ? ?A full examination was performed including scalp, head, eyes, ears, nose, lips, neck, chest, axillae, abdomen, back, buttocks, bilateral upper extremities, bilateral lower extremities, hands, feet, fingers, toes, fingernails, and toenails. All findings within normal limits unless otherwise noted below.  Areas beneath undergarments not fully examined. ? ? ?Assessment & Plan  ?Encounter for screening for malignant neoplasm of skin ? ?Encouraged to self examine twice annually, continue ultraviolet protection. ? ?Cafe au lait spots ?Right Thigh - Posterior ? ?No intervention necessary ? ?Nevus (2) ?Right Inframammary Fold; Mid Back ? ?Leave if  stable ? ?Seborrheic dermatitis ?Right Inferior Crus of Antihelix; Scalp ? ?For the scalp she will get over-the-counter tar some shampoo and use it 1-2 times weekly for 2 months, taper if improved.  For the ear she will apply generic Lotrimin lotion in the morning and 1% hydrocortisone cream at night for 3 weeks. ? ?Acne vulgaris ?Head - Anterior (Face) ? ?Essentially all treatment options discussed in detail including isotretinoin.  She will start with minocycline 50 mg twice daily, side effects detailed.  She may also use over-the-counter Neutrogena rapid clear gel at night.  Recheck 2 months. ? ?Related Medications ?Minocycline HCl Micronized (AMZEEQ) 4 % FOAM ?Apply 1 application. topically at bedtime. ? ? ?

## 2021-10-04 ENCOUNTER — Other Ambulatory Visit: Payer: Self-pay | Admitting: Obstetrics and Gynecology

## 2021-10-04 DIAGNOSIS — R928 Other abnormal and inconclusive findings on diagnostic imaging of breast: Secondary | ICD-10-CM

## 2021-10-19 ENCOUNTER — Ambulatory Visit
Admission: RE | Admit: 2021-10-19 | Discharge: 2021-10-19 | Disposition: A | Discharge status: Home / Self Care | Payer: 59 | Source: Ambulatory Visit | Attending: Obstetrics and Gynecology | Admitting: Obstetrics and Gynecology

## 2021-10-19 DIAGNOSIS — R928 Other abnormal and inconclusive findings on diagnostic imaging of breast: Secondary | ICD-10-CM

## 2021-10-19 DIAGNOSIS — N6489 Other specified disorders of breast: Secondary | ICD-10-CM | POA: Diagnosis not present

## 2022-02-01 ENCOUNTER — Encounter: Payer: Self-pay | Admitting: Orthopedic Surgery

## 2022-02-01 ENCOUNTER — Ambulatory Visit (INDEPENDENT_AMBULATORY_CARE_PROVIDER_SITE_OTHER): Payer: 59 | Admitting: Family

## 2022-02-01 ENCOUNTER — Encounter: Payer: Self-pay | Admitting: Family

## 2022-02-01 VITALS — BP 100/64 | HR 74 | Temp 97.9°F | Resp 16 | Ht 66.5 in | Wt 148.2 lb

## 2022-02-01 DIAGNOSIS — R300 Dysuria: Secondary | ICD-10-CM

## 2022-02-01 DIAGNOSIS — R350 Frequency of micturition: Secondary | ICD-10-CM | POA: Diagnosis not present

## 2022-02-01 LAB — POCT URINALYSIS DIPSTICK
Bilirubin, UA: NEGATIVE
Glucose, UA: NEGATIVE
Ketones, UA: POSITIVE
Nitrite, UA: POSITIVE
Protein, UA: NEGATIVE
Spec Grav, UA: <=1.005 — AB (ref 1.010–1.025)
Urobilinogen, UA: NEGATIVE E.U./dL — AB
pH, UA: 7.5 (ref 5.0–8.0)

## 2022-02-01 MED ORDER — CIPROFLOXACIN HCL 500 MG PO TABS: 500.0000 mg | ORAL_TABLET | Freq: Two times a day (BID) | ORAL | 0 refills | Status: AC

## 2022-02-01 NOTE — Progress Notes (Signed)
Provider: Sostenes Kauffmann FNP-C  Yvonna Alanis, NP  Patient Care Team: Yvonna Alanis, NP as PCP - General (Adult Health Nurse Practitioner) Lavonna Monarch, MD as Consulting Physician (Dermatology)  Extended Emergency Contact Information Primary Emergency Contact: Mathis Dad Address: [address]          [place], [place] [address] Johnnette Litter of [place] Mobile Phone: [phone] Relation: Spouse  Code Status:  Full Code  Goals of care: Advanced Directive information    02/01/2022    2:23 PM  Advanced Directives  Does Patient Have a Medical Advance Directive? No  Would patient like information on creating a medical advance directive? No - Patient declined     Chief Complaint  Patient presents with   Acute Visit    Patient complains of possible UTI. Symptoms are urinary frequency, dysuria, bladder discomfort. Patient states this has been going on 4 days.     HPI:  Pt is a 41 y.o. female seen today for an acute visit for evaluation of urine frequency,dysuria,bladder discomfort x 4 days.she denies any fever,chills,nausea,vomiting,abdominal pain,flank pain,urgency,difficult urination or hematuria. Appetite described as good.   History reviewed. No pertinent past medical history. Past Surgical History:  Procedure Laterality Date   DILATION AND CURETTAGE OF UTERUS  2008   TONSILLECTOMY AND ADENOIDECTOMY  2000    Allergies  Allergen Reactions   Sulfa Antibiotics Hives   Xyzal [Levocetirizine]     "Felt drugged"   Triamcinolone Dermatitis and Rash    Outpatient Encounter Medications as of 02/01/2022  Medication Sig   clindamycin (CLEOCIN T) 1 % lotion Apply 1 Application topically 2 (two) times daily as needed.   IBUPROFEN PO Take by mouth. 600-800 mg 1 to 2 times a week   Multiple Vitamins-Minerals (WOMENS MULTIVITAMIN PO) Take by mouth daily.   Omega-3 Fatty Acids (FISH OIL PO) Take by mouth daily.   Probiotic Product (PROBIOTIC-10 PO) Probiotic  1 TAB, DAILY    VITAMIN D, CHOLECALCIFEROL, PO Take by mouth.   [DISCONTINUED] Amoxicillin-Pot Clavulanate (AUGMENTIN PO) Take by mouth. PT UNSURE OF DOSAGE   [DISCONTINUED] clindamycin (CLEOCIN-T) 1 % lotion Apply topically 2 (two) times daily. Apply to cystic acne   [DISCONTINUED] Minocycline HCl Micronized (AMZEEQ) 4 % FOAM Apply 1 application. topically at bedtime.   [DISCONTINUED] predniSONE (DELTASONE) 50 MG tablet Take 50 mg by mouth daily with breakfast. PT IS UNSURE   [DISCONTINUED] triamcinolone cream (KENALOG) 0.1 % Apply 1 application topically 2 (two) times daily. (Patient not taking: Reported on 09/19/2021)   No facility-administered encounter medications on file as of 02/01/2022.    Review of Systems  Constitutional:  Negative for appetite change, chills, fatigue and fever.  Gastrointestinal:  Negative for abdominal distention, abdominal pain, nausea and vomiting.  Genitourinary:  Positive for dysuria and frequency. Negative for difficulty urinating, hematuria and urgency.  Musculoskeletal:  Negative for arthralgias and back pain.    Immunization History  Administered Date(s) Administered   DTaP 07/18/2019   Influenza-Unspecified 04/16/2020, 04/08/2021   Moderna SARS-COV2 Booster Vaccination 05/25/2020   PFIZER(Purple Top)SARS-COV-2 Vaccination 07/29/2019, 08/19/2019, 04/16/2021   Pertinent  Health Maintenance Due  Topic Date Due   PAP SMEAR-Modifier  Never done   INFLUENZA VACCINE  02/14/2022      01/06/2021    9:25 AM 04/21/2021    3:20 PM 08/04/2021    8:28 AM 02/01/2022    2:23 PM  [place] in the past year? 0 0 0 0  Was there an injury  with Fall? 0 0 0 0  Fall Risk Category Calculator 0 0 0 0  Fall Risk Category Low Low Low Low  Patient Fall Risk Level Low fall risk Low fall risk Low fall risk Low fall risk  Patient at Risk for Falls Due to No Fall Risks No Fall Risks No Fall Risks No Fall Risks  Fall risk Follow up Falls evaluation completed;Education provided;Falls  prevention discussed Falls evaluation completed;Education provided;Falls prevention discussed Falls evaluation completed;Education provided;Falls prevention discussed Falls evaluation completed   Functional Status Survey:    Vitals:   02/01/22 1417  BP: 100/64  Pulse: 74  Resp: 16  Temp: 97.9 F (36.6 C)  SpO2: 97%  Weight: 148 lb 3.2 oz (67.2 kg)  Height: 5' 6.5" (1.689 m)   Body mass index is 23.56 kg/m. Physical Exam Vitals reviewed.  Constitutional:      General: She is not in acute distress.    Appearance: Normal appearance. She is normal weight. She is not ill-appearing or diaphoretic.  HENT:     Head: Normocephalic.     Mouth/Throat:     Mouth: Mucous membranes are moist.  Cardiovascular:     Rate and Rhythm: Normal rate and regular rhythm.     Pulses: Normal pulses.     Heart sounds: Normal heart sounds. No murmur heard.    No friction rub. No gallop.  Pulmonary:     Effort: Pulmonary effort is normal. No respiratory distress.     Breath sounds: Normal breath sounds. No wheezing, rhonchi or rales.  Chest:     Chest wall: No tenderness.  Abdominal:     General: Bowel sounds are normal. There is no distension.     Palpations: Abdomen is soft. There is no mass.     Tenderness: There is no abdominal tenderness. There is no right CVA tenderness, left CVA tenderness, guarding or rebound.  Skin:    General: Skin is warm and dry.     Coloration: Skin is not pale.     Findings: No erythema or rash.  Neurological:     Mental Status: She is alert and oriented to person, place, and time.     Motor: No weakness.     Gait: Gait normal.  Psychiatric:        Mood and Affect: Mood normal.        Speech: Speech normal.        Behavior: Behavior normal.     Labs reviewed: Recent Labs    08/04/21 0904  NA 140  K 4.0  CL 105  CO2 32  GLUCOSE 58*  BUN 14  CREATININE 0.89  CALCIUM 9.7   No results for input(s): "AST", "ALT", "ALKPHOS", "BILITOT", "PROT",  "ALBUMIN" in the last 8760 hours. Recent Labs    08/04/21 0904  WBC 7.3  NEUTROABS 5,190  HGB 12.9  HCT 39.9  MCV 88.9  PLT 231   No results found for: "TSH" No results found for: "HGBA1C" Lab Results  Component Value Date   CHOL 115 08/13/2020   HDL 39 (L) 08/13/2020   LDLCALC 64 08/13/2020   TRIG 50 08/13/2020   CHOLHDL 2.9 08/13/2020    Significant Diagnostic Results in last 30 days:  No results found.  Assessment/Plan  1. Urinary frequency Afebrile - POC Urinalysis Dipstick indicates orange-red cloudy urine positive for ketones, large blood, nitrites and large 3+ leukocytes Urine dipstick results discussed with patient.Will send urine for culture.prefers to start on antibiotics in the meantime while  waiting for final urine culture results.Aware that might need to change antibiotics if culture and sensitivity indicates need for a different antibiotics. - Urine Culture  2. Dysuria - POC Urinalysis Dipstick results as above - Urine Culture - ciprofloxacin (CIPRO) 500 MG tablet; Take 1 tablet (500 mg total) by mouth 2 (two) times daily for 7 days.  Dispense: 14 tablet; Refill: 0   Family/ staff Communication: Reviewed plan of care with patient verbalized understanding  Labs/tests ordered:   - POC Urinalysis Dipstick - Urine Culture  Next Appointment: Return if symptoms worsen or fail to improve.   Sandrea Hughs, NP

## 2022-02-01 NOTE — Patient Instructions (Signed)
Urinary Tract Infection, Adult  A urinary tract infection (UTI) is an infection of any part of the urinary tract. The urinary tract includes the kidneys, ureters, bladder, and urethra. These organs make, store, and get rid of urine in the body. An upper UTI affects the ureters and kidneys. A lower UTI affects the bladder and urethra. What are the causes? Most urinary tract infections are caused by bacteria in your genital area around your urethra, where urine leaves your body. These bacteria grow and cause inflammation of your urinary tract. What increases the risk? You are more likely to develop this condition if: You have a urinary catheter that stays in place. You are not able to control when you urinate or have a bowel movement (incontinence). You are female and you: Use a spermicide or diaphragm for birth control. Have low estrogen levels. Are pregnant. You have certain genes that increase your risk. You are sexually active. You take antibiotic medicines. You have a condition that causes your flow of urine to slow down, such as: An enlarged prostate, if you are female. Blockage in your urethra. A kidney stone. A nerve condition that affects your bladder control (neurogenic bladder). Not getting enough to drink, or not urinating often. You have certain medical conditions, such as: Diabetes. A weak disease-fighting system (immunesystem). Sickle cell disease. Gout. Spinal cord injury. What are the signs or symptoms? Symptoms of this condition include: Needing to urinate right away (urgency). Frequent urination. This may include small amounts of urine each time you urinate. Pain or burning with urination. Blood in the urine. Urine that smells bad or unusual. Trouble urinating. Cloudy urine. Vaginal discharge, if you are female. Pain in the abdomen or the lower back. You may also have: Vomiting or a decreased appetite. Confusion. Irritability or tiredness. A fever or  chills. Diarrhea. The first symptom in older adults may be confusion. In some cases, they may not have any symptoms until the infection has worsened. How is this diagnosed? This condition is diagnosed based on your medical history and a physical exam. You may also have other tests, including: Urine tests. Blood tests. Tests for STIs (sexually transmitted infections). If you have had more than one UTI, a cystoscopy or imaging studies may be done to determine the cause of the infections. How is this treated? Treatment for this condition includes: Antibiotic medicine. Over-the-counter medicines to treat discomfort. Drinking enough water to stay hydrated. If you have frequent infections or have other conditions such as a kidney stone, you may need to see a health care provider who specializes in the urinary tract (urologist). In rare cases, urinary tract infections can cause sepsis. Sepsis is a life-threatening condition that occurs when the body responds to an infection. Sepsis is treated in the hospital with IV antibiotics, fluids, and other medicines. Follow these instructions at home:  Medicines Take over-the-counter and prescription medicines only as told by your health care provider. If you were prescribed an antibiotic medicine, take it as told by your health care provider. Do not stop using the antibiotic even if you start to feel better. General instructions Make sure you: Empty your bladder often and completely. Do not hold urine for long periods of time. Empty your bladder after sex. Wipe from front to back after urinating or having a bowel movement if you are female. Use each tissue only one time when you wipe. Drink enough fluid to keep your urine pale yellow. Keep all follow-up visits. This is important. Contact a health   care provider if: Your symptoms do not get better after 1-2 days. Your symptoms go away and then return. Get help right away if: You have severe pain in  your back or your lower abdomen. You have a fever or chills. You have nausea or vomiting. Summary A urinary tract infection (UTI) is an infection of any part of the urinary tract, which includes the kidneys, ureters, bladder, and urethra. Most urinary tract infections are caused by bacteria in your genital area. Treatment for this condition often includes antibiotic medicines. If you were prescribed an antibiotic medicine, take it as told by your health care provider. Do not stop using the antibiotic even if you start to feel better. Keep all follow-up visits. This is important. This information is not intended to replace advice given to you by your health care provider. Make sure you discuss any questions you have with your health care provider. Document Revised: 02/13/2020 Document Reviewed: 02/13/2020 Elsevier Patient Education  2023 Elsevier Inc.  

## 2022-02-02 LAB — URINE CULTURE
MICRO NUMBER:: 13668119
SPECIMEN QUALITY:: ADEQUATE

## 2022-04-17 ENCOUNTER — Encounter: Payer: 59 | Admitting: Adult Health

## 2022-04-17 ENCOUNTER — Ambulatory Visit: Payer: 59 | Admitting: Orthopedic Surgery

## 2022-04-23 NOTE — Progress Notes (Signed)
This encounter was created in error - please disregard.

## 2022-04-27 ENCOUNTER — Other Ambulatory Visit (HOSPITAL_BASED_OUTPATIENT_CLINIC_OR_DEPARTMENT_OTHER): Payer: Self-pay

## 2022-04-27 ENCOUNTER — Ambulatory Visit: Payer: 59 | Admitting: Orthopedic Surgery

## 2022-04-27 ENCOUNTER — Encounter: Payer: Self-pay | Admitting: Orthopedic Surgery

## 2022-04-27 VITALS — BP 100/60 | HR 75 | Temp 97.1°F | Ht 66.5 in | Wt 147.6 lb

## 2022-04-27 DIAGNOSIS — L219 Seborrheic dermatitis, unspecified: Secondary | ICD-10-CM

## 2022-04-27 DIAGNOSIS — L700 Acne vulgaris: Secondary | ICD-10-CM | POA: Diagnosis not present

## 2022-04-27 DIAGNOSIS — T3695XA Adverse effect of unspecified systemic antibiotic, initial encounter: Secondary | ICD-10-CM | POA: Diagnosis not present

## 2022-04-27 DIAGNOSIS — B379 Candidiasis, unspecified: Secondary | ICD-10-CM | POA: Diagnosis not present

## 2022-04-27 MED ORDER — DOXYCYCLINE MONOHYDRATE 50 MG PO TABS
50.0000 mg | ORAL_TABLET | Freq: Two times a day (BID) | ORAL | 0 refills | Status: AC
  Filled 2022-04-27: qty 28, 14d supply, fill #0

## 2022-04-27 MED ORDER — FLUCONAZOLE 150 MG PO TABS
150.0000 mg | ORAL_TABLET | Freq: Once | ORAL | 0 refills | Status: AC
  Filled 2022-04-27: qty 1, 1d supply, fill #0

## 2022-04-27 NOTE — Patient Instructions (Addendum)
[facility]- [id number]  Ketoconazole (Nizoral) shampoo to left ear- let medication sit for 5-10 minutes

## 2022-04-27 NOTE — Progress Notes (Signed)
Careteam: Patient Care Team: Yvonna Alanis, NP as PCP - General (Adult Health Nurse Practitioner) Lavonna Monarch, MD (Inactive) as Consulting Physician (Dermatology)  Seen by: Windell Moulding, AGNP-C  PLACE OF SERVICE:  [place] Directive information    Allergies  Allergen Reactions   Sulfa Antibiotics Hives   Xyzal [Levocetirizine]     "Felt drugged"   Triamcinolone Dermatitis and Rash    Chief Complaint  Patient presents with   Acute Visit    Patient presents today for acne on her face and need a new dermatology referral.     HPI: Patient is a 41 y.o. female seen today for acute visit due to cystic acne.   Ongoing acne > 1 year. She has tried multiple interventions including salicylic acid, minocycline, and various OTC products. Outbreaks occurring weekly. Acne increased around menstrual cycle or after. Outbreaks over chin/lower face area, painful at times. She was followed by [facility] but they closed. Requesting new referral.    Review of Systems:  Review of Systems  Constitutional:  Negative for chills and fever.  Respiratory:  Negative for cough, shortness of breath and wheezing.   Cardiovascular:  Negative for chest pain and leg swelling.  Skin:        acne  Psychiatric/Behavioral:  Negative for depression. The patient is not nervous/anxious.     No past medical history on file. Past Surgical History:  Procedure Laterality Date   DILATION AND CURETTAGE OF UTERUS  2008   TONSILLECTOMY AND ADENOIDECTOMY  2000   Social History:   reports that she has quit smoking. Her smoking use included cigarettes. She has a 3.00 pack-year smoking history. She has never used smokeless tobacco. She reports current alcohol use. She reports that she does not use drugs.  Family History  Problem Relation Age of Onset   Macular degeneration Mother    Dementia Maternal Grandmother    Cancer Maternal Grandmother    Stroke Maternal Grandfather    Heart  attack Maternal Grandfather    Cancer Paternal Grandmother    Stroke Paternal Grandmother    Stroke Paternal Grandfather    Heart attack Paternal Grandfather    Breast cancer Neg Hx     Medications: Patient's Medications  New Prescriptions   No medications on file  Previous Medications   CLINDAMYCIN (CLEOCIN T) 1 % LOTION    Apply 1 Application topically 2 (two) times daily as needed.   IBUPROFEN PO    Take by mouth. 600-800 mg 1 to 2 times a week   MULTIPLE VITAMINS-MINERALS (WOMENS MULTIVITAMIN PO)    Take by mouth daily.   OMEGA-3 FATTY ACIDS (FISH OIL PO)    Take by mouth daily.   PROBIOTIC PRODUCT (PROBIOTIC-10 PO)    Probiotic  1 TAB, DAILY   VITAMIN D, CHOLECALCIFEROL, PO    Take by mouth.  Modified Medications   No medications on file  Discontinued Medications   No medications on file    Physical Exam:  Vitals:   04/27/22 1415  BP: 100/60  Pulse: 75  Temp: (!) 97.1 F (36.2 C)  SpO2: 98%  Weight: 147 lb 9.6 oz (67 kg)  Height: 5' 6.5" (1.689 m)   Body mass index is 23.47 kg/m. Wt Readings from Last 3 Encounters:  04/27/22 147 lb 9.6 oz (67 kg)  02/01/22 148 lb 3.2 oz (67.2 kg)  08/04/21 147 lb 12.8 oz (67 kg)    Physical Exam Vitals reviewed.  Constitutional:  General: She is not in acute distress. HENT:     Head: Normocephalic.     Right Ear: There is no impacted cerumen.     Left Ear: There is no impacted cerumen.     Ears:     Comments: Dry flaking skin to outer ear, near canal opening    Nose: Nose normal.  Eyes:     General:        Right eye: No discharge.        Left eye: No discharge.  Cardiovascular:     Rate and Rhythm: Normal rate and regular rhythm.     Pulses: Normal pulses.     Heart sounds: Normal heart sounds.  Pulmonary:     Effort: Pulmonary effort is normal. No respiratory distress.     Breath sounds: Normal breath sounds. No wheezing.  Musculoskeletal:        General: Normal range of motion.  Skin:    General: Skin  is warm and dry.     Capillary Refill: Capillary refill takes less than 2 seconds.  Neurological:     General: No focal deficit present.     Mental Status: She is alert and oriented to person, place, and time.  Psychiatric:        Mood and Affect: Mood normal.        Behavior: Behavior normal.     Labs reviewed: Basic Metabolic Panel: Recent Labs    08/04/21 0904  NA 140  K 4.0  CL 105  CO2 32  GLUCOSE 58*  BUN 14  CREATININE 0.89  CALCIUM 9.7   Liver Function Tests: No results for input(s): "AST", "ALT", "ALKPHOS", "BILITOT", "PROT", "ALBUMIN" in the last 8760 hours. No results for input(s): "LIPASE", "AMYLASE" in the last 8760 hours. No results for input(s): "AMMONIA" in the last 8760 hours. CBC: Recent Labs    08/04/21 0904  WBC 7.3  NEUTROABS 5,190  HGB 12.9  HCT 39.9  MCV 88.9  PLT 231   Lipid Panel: No results for input(s): "CHOL", "HDL", "LDLCALC", "TRIG", "CHOLHDL", "LDLDIRECT" in the last 8760 hours. TSH: No results for input(s): "TSH" in the last 8760 hours. A1C: No results found for: "HGBA1C"   Assessment/Plan 1. Acne vulgaris - ongoing> 1 year - unsuccessful trial of salicylic acid, minocycline, OTC products - followed by [facility]- practice closed - recommend [facility] - doxycycline (ADOXA) 50 MG tablet; Take 1 tablet (50 mg total) by mouth 2 (two) times daily for 14 days.  Dispense: 28 tablet; Refill: 0  2. Antibiotic-induced yeast infection - see above - fluconazole (DIFLUCAN) 150 MG tablet; Take 1 tablet (150 mg total) by mouth once for 1 dose.  Dispense: 1 tablet; Refill: 0  3. Seborrheic dermatitis - dry skin to outer ears, near ear canal - Col tar shampoo, lotrimin and hydrocortisone unsuccessful  - recommend ketoconazole shampoo 2x/week- let medication sit over ears for 5-10 minutes  Total time: 21 minutes. Greater than 50% of total time spent doing patient education regarding acne and dry skin including  symptom/medication management.     Next appt: 08/10/2022  Windell Moulding, [facility] [phone]

## 2022-07-16 IMAGING — US US BREAST*L* LIMITED INC AXILLA
1 series · 10 of 10 positions shown · non-contrast
Comparison: Previous exam(s).

CLINICAL DATA: Recall from baseline screening mammography, possible
masses in both breasts.



[Series 1: us breast*left* limited inc axilla · 0.06mm/px · 10 of 10 slices shown]
[im 1/10]
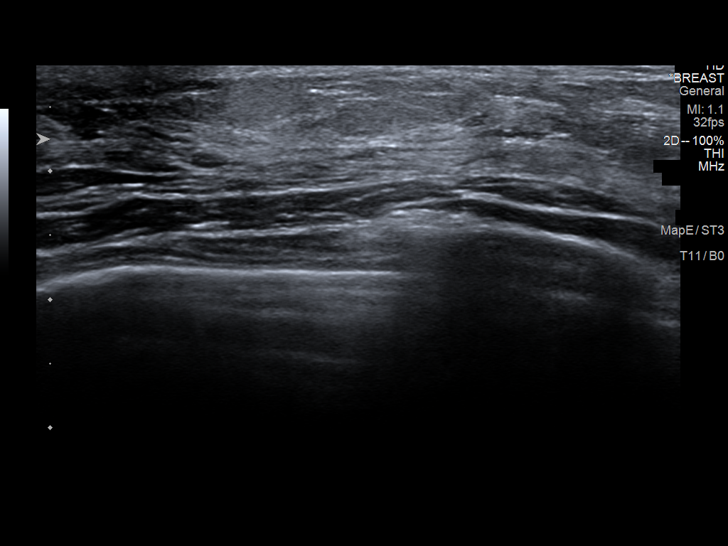
[im 2/10]
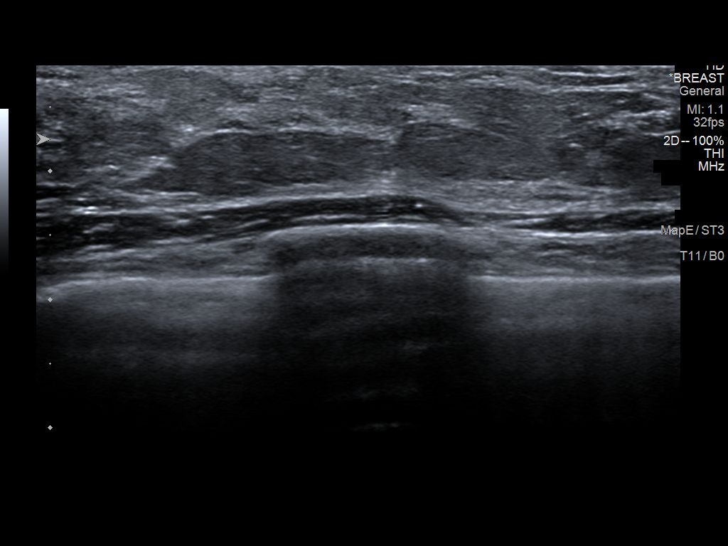
[im 3/10]
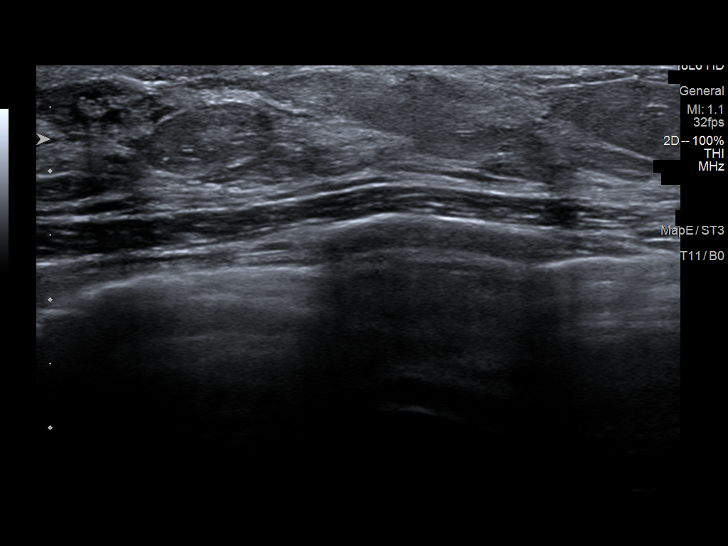
[im 4/10]
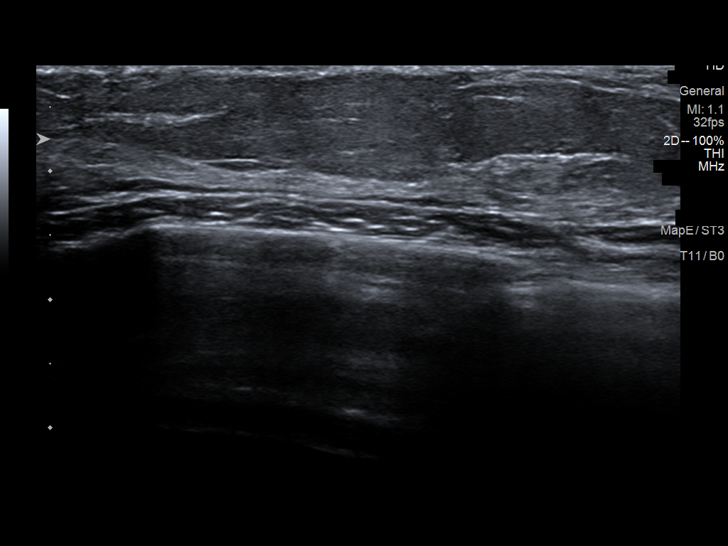
[im 5/10]
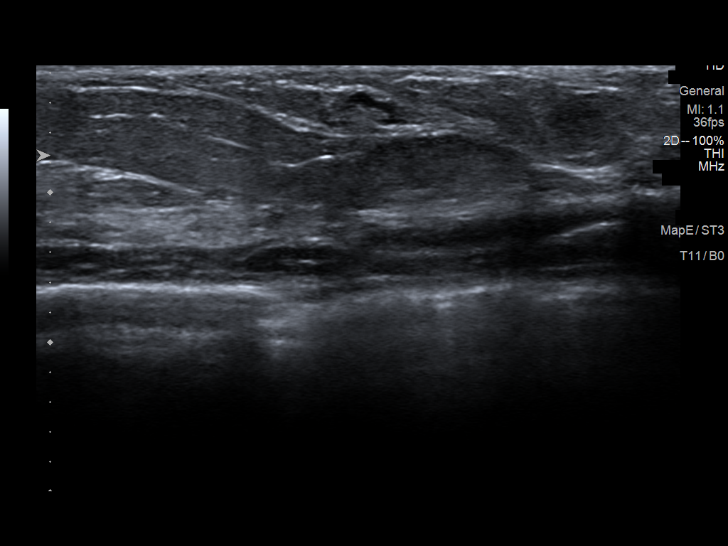
[im 6/10]
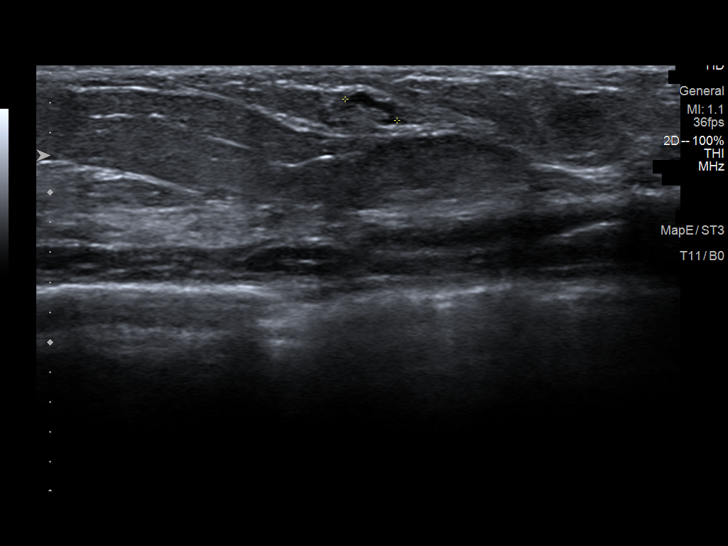
[im 7/10]
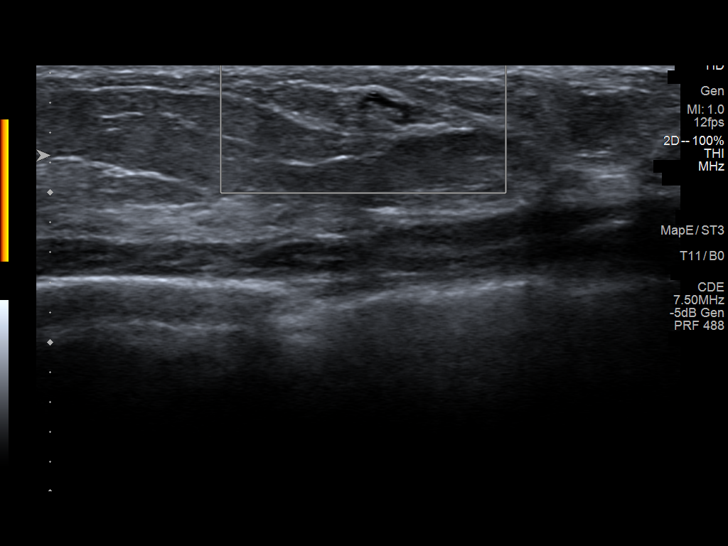
[im 8/10]
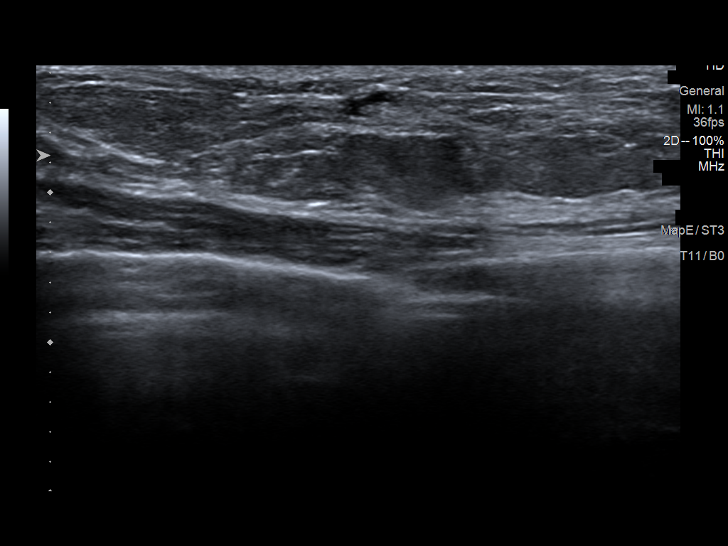
[im 9/10]
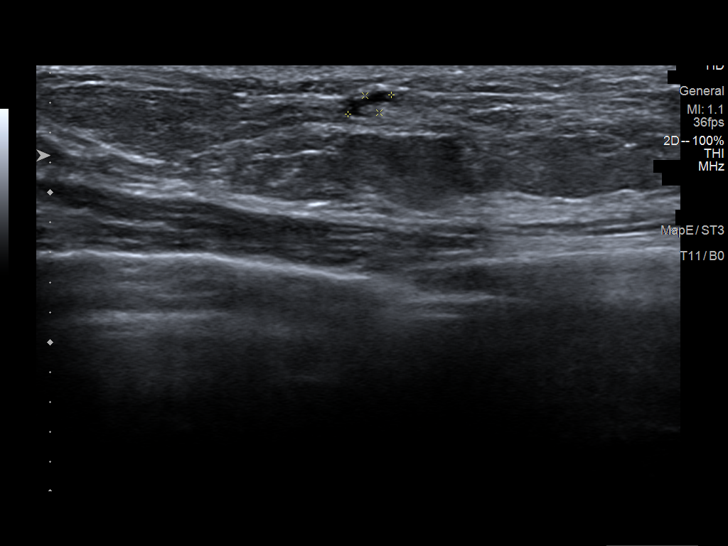
[im 10/10]
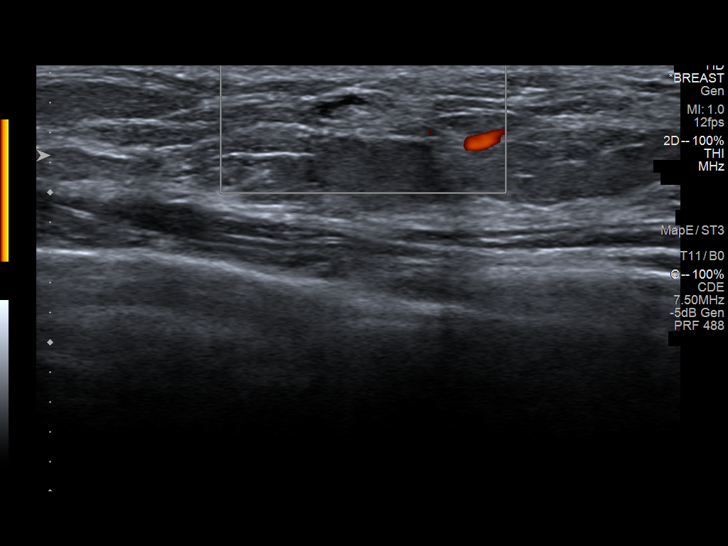

[10 of 10 positions shown; findings below may reference images not displayed]

ACR Breast Density Category b: There are scattered areas of
fibroglandular density.
FINDINGS: Spot-compression CC and MLO views of the area of concern in each
breast were obtained.

RIGHT: Circumscribed isodense mass in the UPPER OUTER QUADRANT at
middle depth measuring approximately 0.5 cm, superficial in
location. There is no associated architectural distortion or
suspicious calcifications.

Targeted ultrasound is performed, demonstrating a normal appearing
intramammary lymph node superficially at the 11 o'clock position 4
cm from the nipple measuring 0.4 x 0.2 x 0.4 cm, with a vessel
entering the fatty hilus on power Doppler evaluation. No suspicious
solid mass or abnormal acoustic shadowing is identified.

LEFT: Circumscribed isodense mass in the lower breast at posterior
depth, possibly LOWER OUTER QUADRANT, measuring approximately
cm, superficial in location. There is no associated architectural
distortion or suspicious calcifications.

Targeted ultrasound is performed, demonstrating a normal appearing
intramammary lymph node superficially at the 4 o'clock position 3 cm
from the nipple measuring 0.4 x 0.2 x 0.3 cm. No suspicious solid
mass or abnormal acoustic shadowing is identified.
IMPRESSION: 1. No mammographic or sonographic evidence of malignancy involving
either breast.
2. Normal appearing intramammary lymph nodes in both breasts which
account for the screening mammographic findings.

RECOMMENDATION:
Screening mammogram in one year.(Code:TA-J-AEL)

I have discussed the findings and recommendations with the patient.
If applicable, a reminder letter will be sent to the patient
regarding the next appointment.

BI-RADS CATEGORY  2: Benign.

## 2022-07-16 IMAGING — US US BREAST*R* LIMITED INC AXILLA
1 series · 7 of 7 positions shown · non-contrast
Comparison: Previous exam(s).

CLINICAL DATA: Recall from baseline screening mammography, possible
masses in both breasts.



[Series 1: us breast*right* limited inc axilla · 0.04mm/px · 7 of 7 slices shown]
[im 1/7]
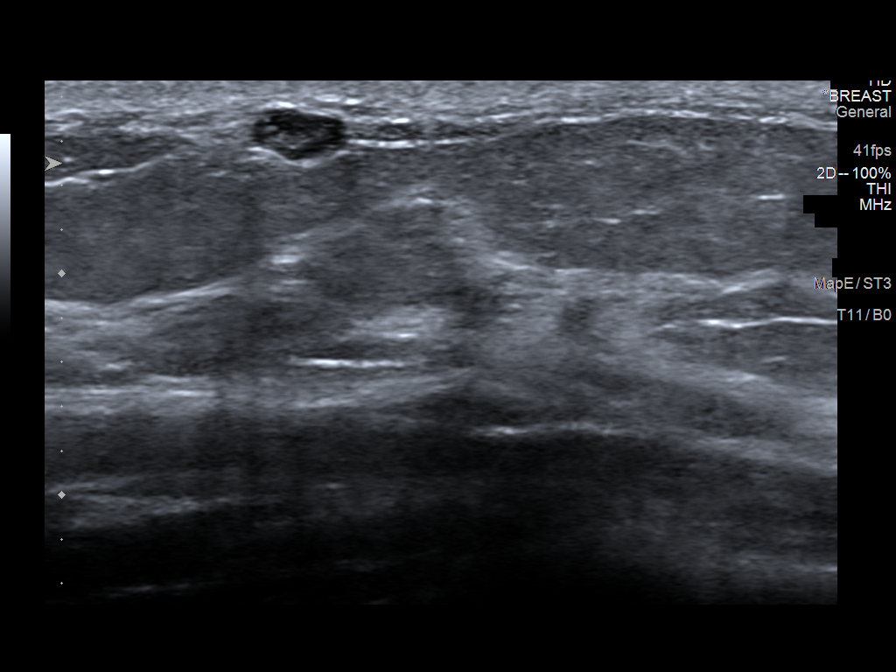
[im 2/7]
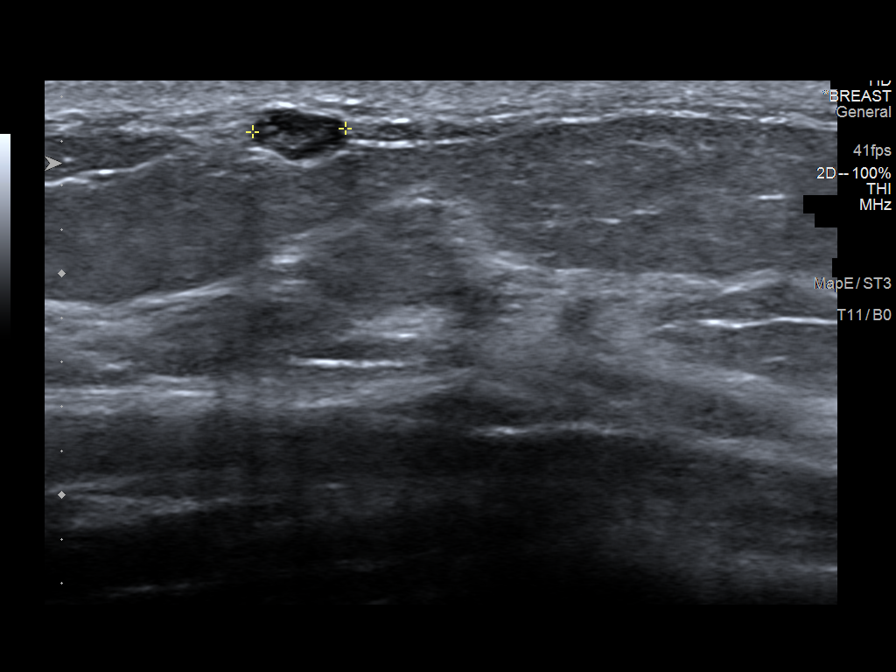
[im 3/7]
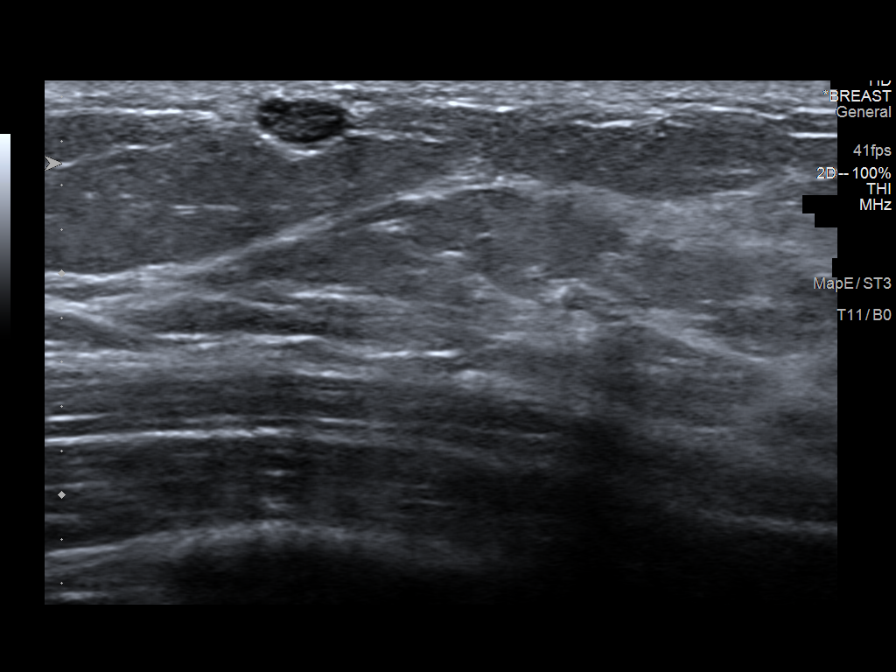
[im 4/7]
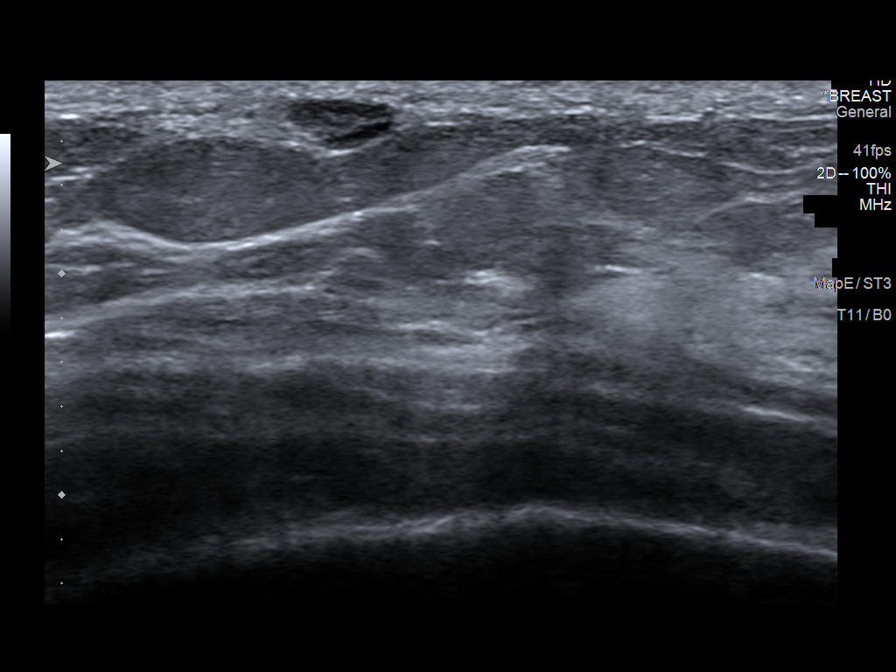
[im 5/7]
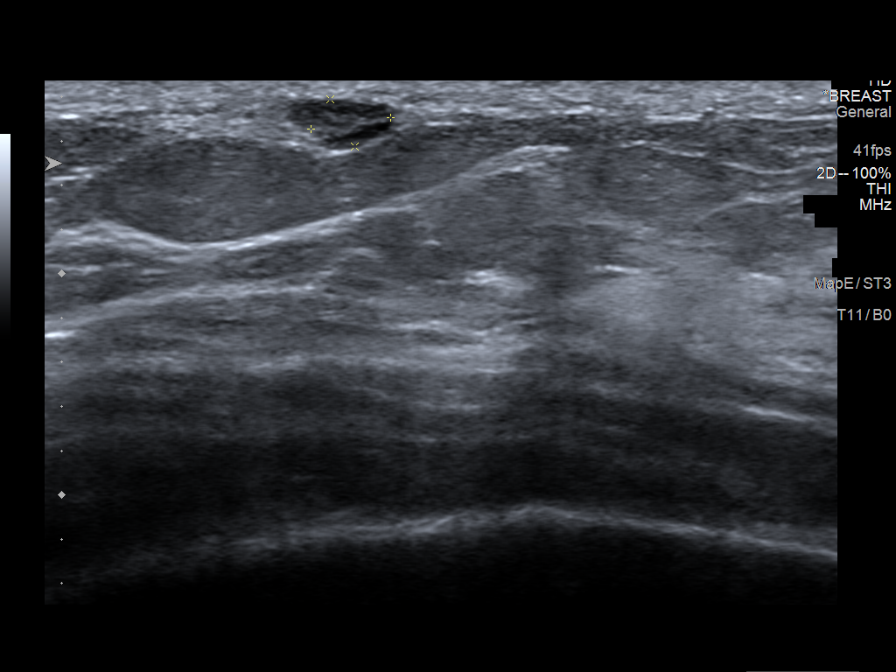
[im 6/7]
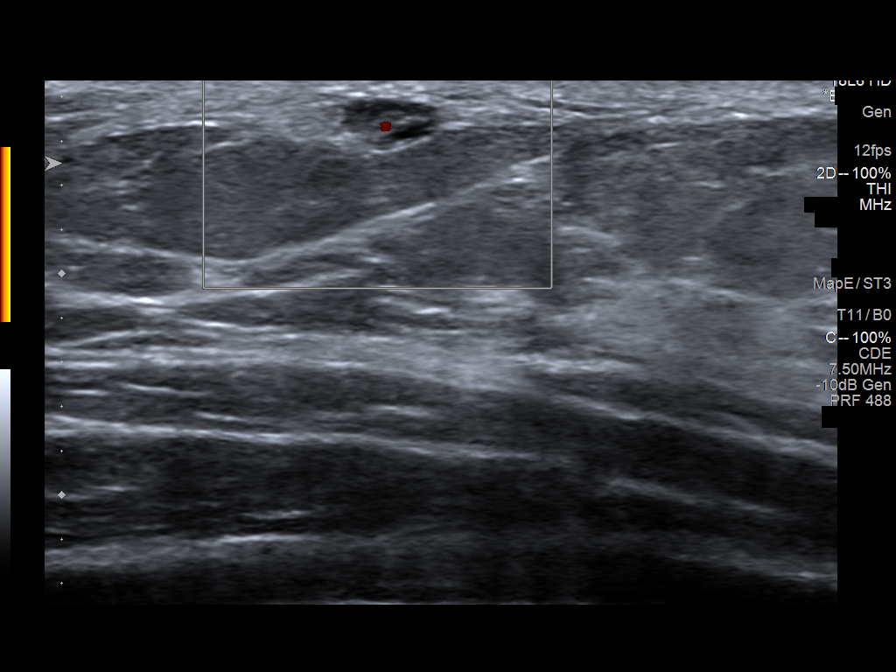
[im 7/7]
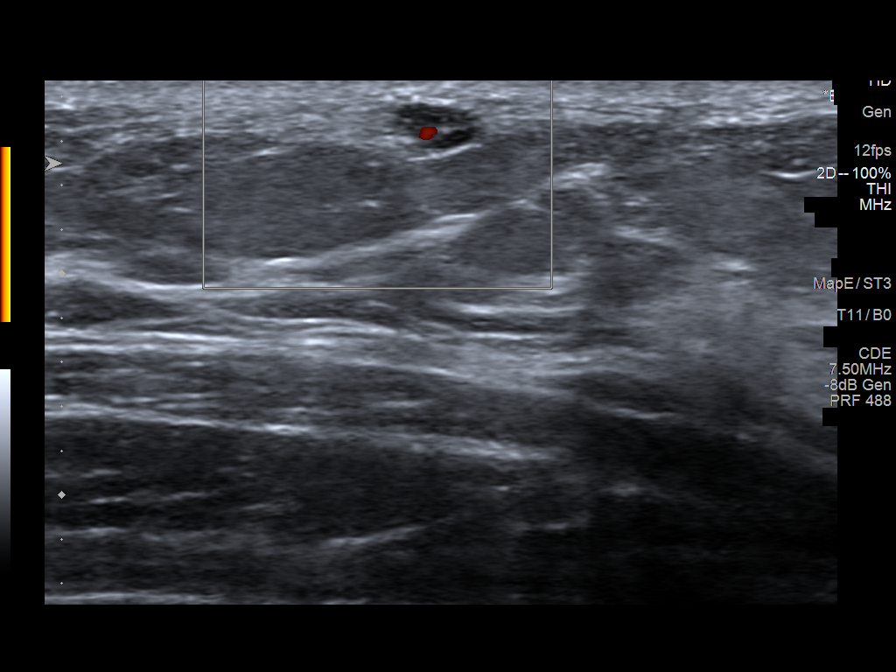

[7 of 7 positions shown; findings below may reference images not displayed]

ACR Breast Density Category b: There are scattered areas of
fibroglandular density.
FINDINGS: Spot-compression CC and MLO views of the area of concern in each
breast were obtained.

RIGHT: Circumscribed isodense mass in the UPPER OUTER QUADRANT at
middle depth measuring approximately 0.5 cm, superficial in
location. There is no associated architectural distortion or
suspicious calcifications.

Targeted ultrasound is performed, demonstrating a normal appearing
intramammary lymph node superficially at the 11 o'clock position 4
cm from the nipple measuring 0.4 x 0.2 x 0.4 cm, with a vessel
entering the fatty hilus on power Doppler evaluation. No suspicious
solid mass or abnormal acoustic shadowing is identified.

LEFT: Circumscribed isodense mass in the lower breast at posterior
depth, possibly LOWER OUTER QUADRANT, measuring approximately
cm, superficial in location. There is no associated architectural
distortion or suspicious calcifications.

Targeted ultrasound is performed, demonstrating a normal appearing
intramammary lymph node superficially at the 4 o'clock position 3 cm
from the nipple measuring 0.4 x 0.2 x 0.3 cm. No suspicious solid
mass or abnormal acoustic shadowing is identified.
IMPRESSION: 1. No mammographic or sonographic evidence of malignancy involving
either breast.
2. Normal appearing intramammary lymph nodes in both breasts which
account for the screening mammographic findings.

RECOMMENDATION:
Screening mammogram in one year.(Code:TA-J-AEL)

I have discussed the findings and recommendations with the patient.
If applicable, a reminder letter will be sent to the patient
regarding the next appointment.

BI-RADS CATEGORY  2: Benign.

## 2022-07-16 IMAGING — MG DIGITAL DIAGNOSTIC BILAT W/ TOMO W/ CAD
8 series · 8 of 24 positions shown · non-contrast
Comparison: Previous exam(s).

CLINICAL DATA: Recall from baseline screening mammography, possible
masses in both breasts.



[L CC synth-2D]
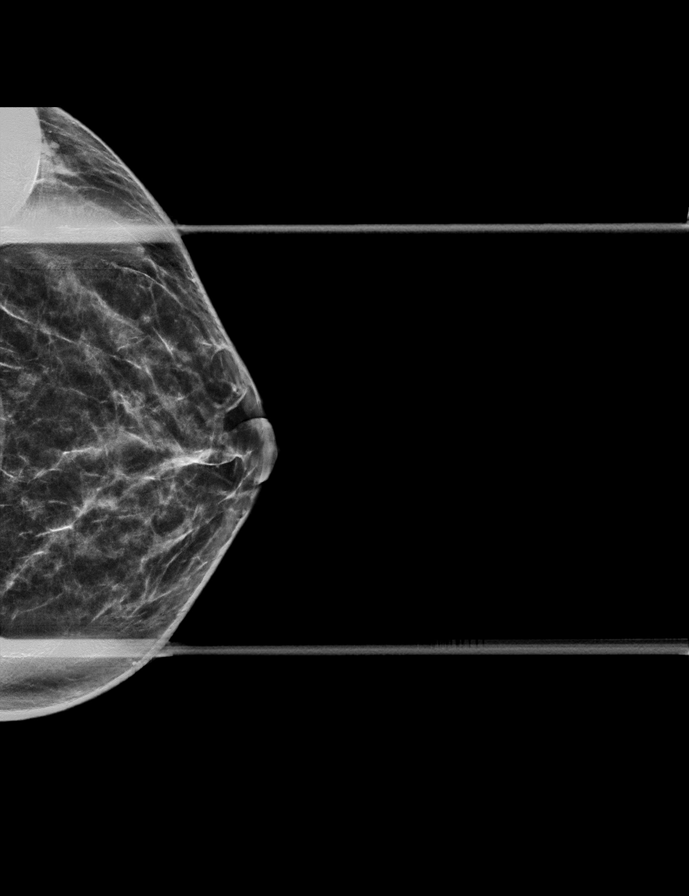

[L MLO synth-2D]
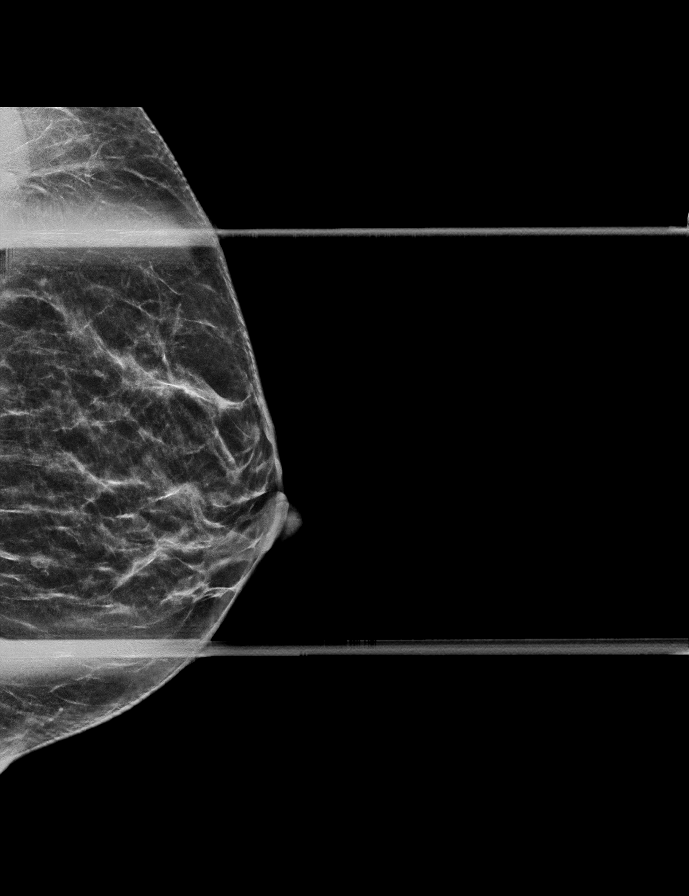

[R MLO synth-2D]
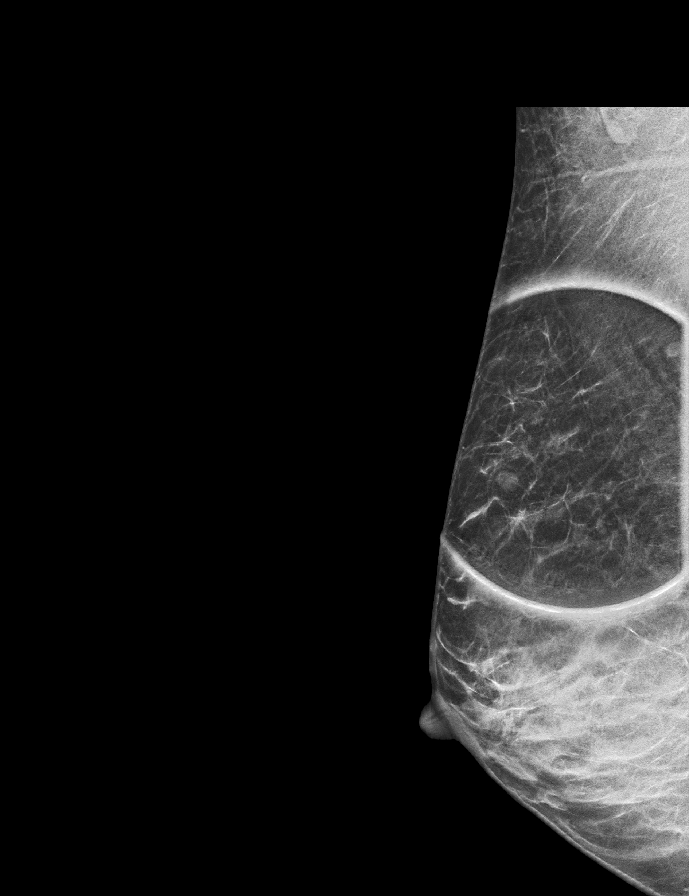

[R CC synth-2D]
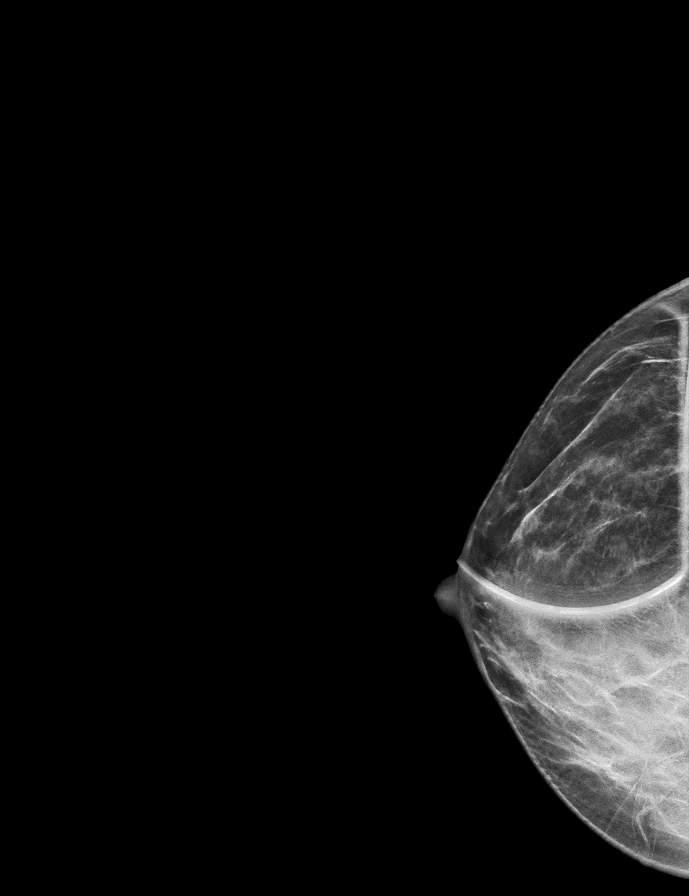

[R MLO tomo · tomo slice 21/40.0]
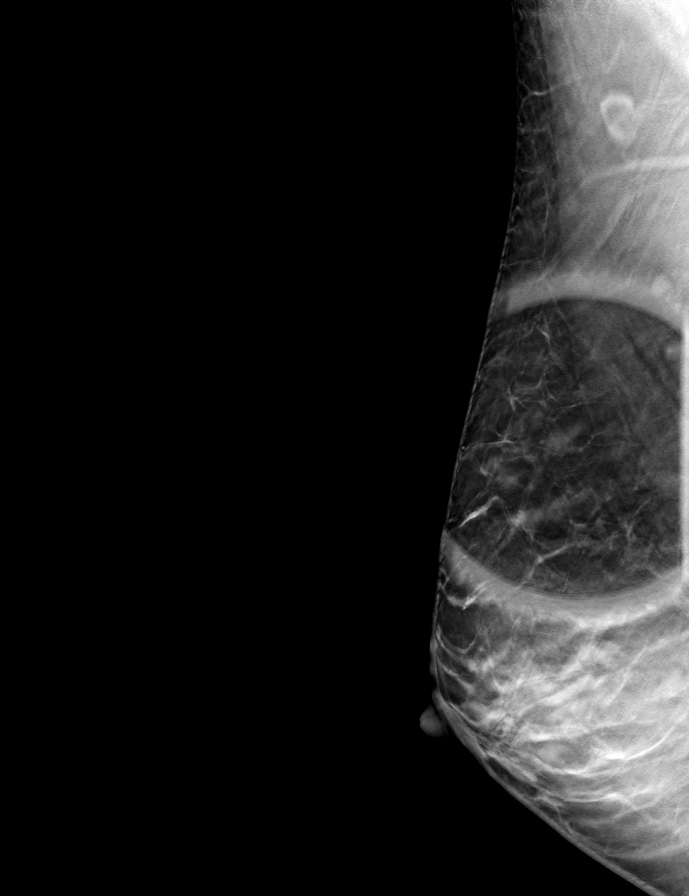

[R CC tomo · tomo slice 23/44.0]
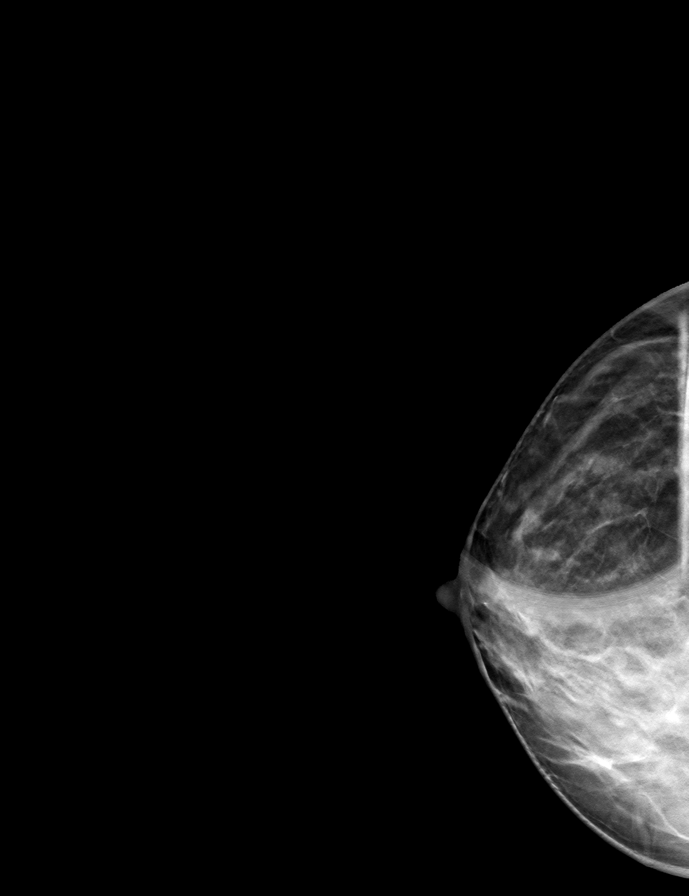

[L MLO tomo · tomo slice 24/47.0]
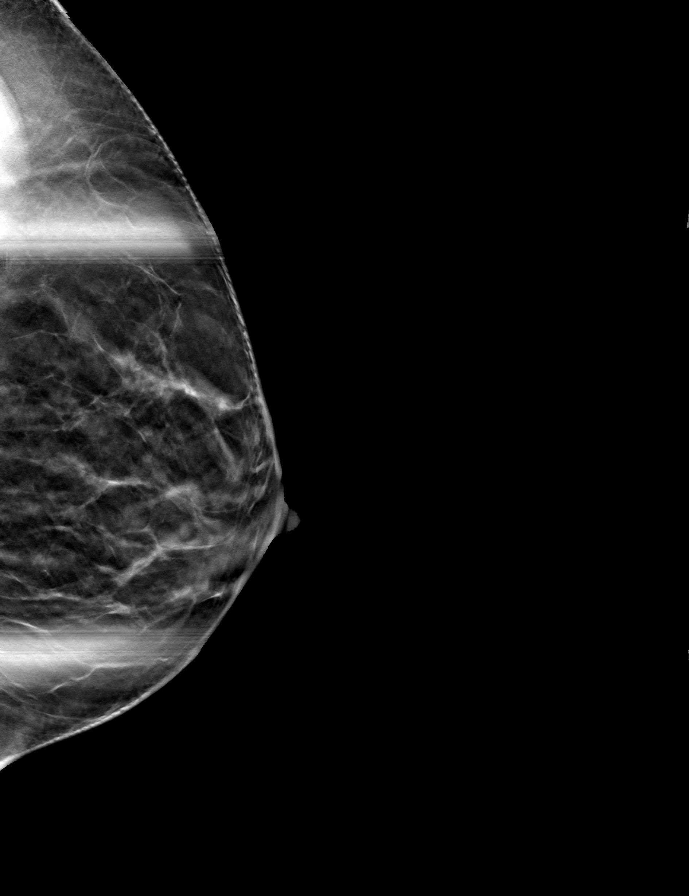

[L CC tomo · tomo slice 27/53.0]
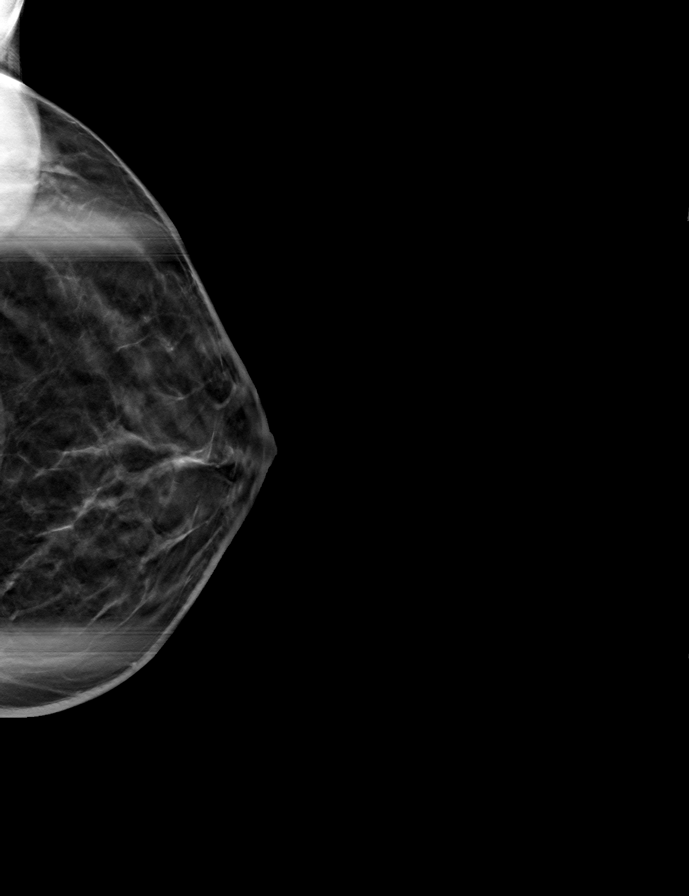

[8 of 24 positions shown; findings below may reference images not displayed]

ACR Breast Density Category b: There are scattered areas of
fibroglandular density.
FINDINGS: Spot-compression CC and MLO views of the area of concern in each
breast were obtained.

RIGHT: Circumscribed isodense mass in the UPPER OUTER QUADRANT at
middle depth measuring approximately 0.5 cm, superficial in
location. There is no associated architectural distortion or
suspicious calcifications.

Targeted ultrasound is performed, demonstrating a normal appearing
intramammary lymph node superficially at the 11 o'clock position 4
cm from the nipple measuring 0.4 x 0.2 x 0.4 cm, with a vessel
entering the fatty hilus on power Doppler evaluation. No suspicious
solid mass or abnormal acoustic shadowing is identified.

LEFT: Circumscribed isodense mass in the lower breast at posterior
depth, possibly LOWER OUTER QUADRANT, measuring approximately
cm, superficial in location. There is no associated architectural
distortion or suspicious calcifications.

Targeted ultrasound is performed, demonstrating a normal appearing
intramammary lymph node superficially at the 4 o'clock position 3 cm
from the nipple measuring 0.4 x 0.2 x 0.3 cm. No suspicious solid
mass or abnormal acoustic shadowing is identified.
IMPRESSION: 1. No mammographic or sonographic evidence of malignancy involving
either breast.
2. Normal appearing intramammary lymph nodes in both breasts which
account for the screening mammographic findings.

RECOMMENDATION:
Screening mammogram in one year.(Code:TA-[id number])

I have discussed the findings and recommendations with the patient.
If applicable, a reminder letter will be sent to the patient
regarding the next appointment.

BI-RADS CATEGORY  2: Benign.

## 2022-08-03 ENCOUNTER — Encounter: Payer: 59 | Admitting: Orthopedic Surgery

## 2022-08-04 NOTE — Progress Notes (Signed)
This encounter was created in error - please disregard.

## 2022-08-07 ENCOUNTER — Ambulatory Visit: Payer: 59 | Admitting: Orthopedic Surgery

## 2022-08-10 ENCOUNTER — Ambulatory Visit: Payer: 59 | Admitting: Orthopedic Surgery

## 2022-08-15 ENCOUNTER — Ambulatory Visit: Payer: 59 | Admitting: Family

## 2022-08-15 ENCOUNTER — Other Ambulatory Visit (HOSPITAL_BASED_OUTPATIENT_CLINIC_OR_DEPARTMENT_OTHER): Payer: Self-pay

## 2022-08-15 ENCOUNTER — Encounter: Payer: Self-pay | Admitting: Family

## 2022-08-15 VITALS — BP 100/70 | HR 70 | Temp 97.5°F | Resp 16 | Ht 66.5 in | Wt 148.8 lb

## 2022-08-15 DIAGNOSIS — J029 Acute pharyngitis, unspecified: Secondary | ICD-10-CM

## 2022-08-15 DIAGNOSIS — H6502 Acute serous otitis media, left ear: Secondary | ICD-10-CM | POA: Diagnosis not present

## 2022-08-15 MED ORDER — AMOXICILLIN-POT CLAVULANATE 875-125 MG PO TABS
1.0000 | ORAL_TABLET | Freq: Two times a day (BID) | ORAL | 0 refills | Status: AC
  Filled 2022-08-15: qty 14, 7d supply, fill #0

## 2022-08-15 NOTE — Progress Notes (Signed)
Provider: Lavarius Doughten FNP-C  Yvonna Alanis, NP  Patient Care Team: Yvonna Alanis, NP as PCP - General (Adult Health Nurse Practitioner) Lavonna Monarch, MD (Inactive) as Consulting Physician (Dermatology)  Extended Emergency Contact Information Primary Emergency Contact: Mathis Dad Address: [address]          [place], [place] [address] Johnnette Litter of [place] Mobile Phone: [phone] Relation: Spouse  Code Status:  Full Code  Goals of care: Advanced Directive information    08/15/2022    2:49 PM  Advanced Directives  Does Patient Have a Medical Advance Directive? No  Would patient like information on creating a medical advance directive? No - Patient declined     Chief Complaint  Patient presents with   Acute Visit    Patient complains of nasal congestion, ear pain, and cough.    HPI:  Pt is a 42 y.o. female seen today for an acute visit for evaluation of runny nose at night when going to sleep and a little cough.sinus pain with bending but getting better.Has had sore throat.  Left ear pain and feeling full.pain is constant.Has had dryness in the ear but left ear has been having drainage.No hearing loss. Had PCN at the beginning of the month. Family members have been sick with Flu in mid [place].   History reviewed. No pertinent past medical history. Past Surgical History:  Procedure Laterality Date   DILATION AND CURETTAGE OF UTERUS  2008   TONSILLECTOMY AND ADENOIDECTOMY  2000    Allergies  Allergen Reactions   Sulfa Antibiotics Hives   Xyzal [Levocetirizine]     "Felt drugged"   Triamcinolone Dermatitis and Rash    Outpatient Encounter Medications as of 08/15/2022  Medication Sig   clindamycin (CLEOCIN T) 1 % lotion Apply 1 Application topically 2 (two) times daily as needed.   IBUPROFEN PO Take by mouth. 600-800 mg 1 to 2 times a week   Multiple Vitamins-Minerals (WOMENS MULTIVITAMIN PO) Take by mouth daily.   Omega-3 Fatty Acids (FISH OIL PO)  Take by mouth daily.   Probiotic Product (PROBIOTIC-10 PO) Probiotic  1 TAB, DAILY   VITAMIN D, CHOLECALCIFEROL, PO Take by mouth.   No facility-administered encounter medications on file as of 08/15/2022.    Review of Systems  Constitutional:  Negative for appetite change, chills, fatigue, fever and unexpected weight change.  HENT:  Positive for ear discharge, ear pain, rhinorrhea, sinus pain and sore throat. Negative for congestion, dental problem, facial swelling, hearing loss, nosebleeds, postnasal drip, sinus pressure, sneezing, tinnitus and trouble swallowing.   Eyes:  Negative for pain, discharge, redness, itching and visual disturbance.  Respiratory:  Positive for cough. Negative for chest tightness, shortness of breath and wheezing.   Cardiovascular:  Negative for chest pain, palpitations and leg swelling.  Gastrointestinal:  Negative for abdominal distention, abdominal pain, constipation, diarrhea, nausea and vomiting.  Skin:  Negative for color change, pallor and rash.  Neurological:  Negative for dizziness, weakness, light-headedness, numbness and headaches.    Immunization History  Administered Date(s) Administered   DTaP 07/18/2019   Influenza-Unspecified 04/16/2020, 04/08/2021   Moderna SARS-COV2 Booster Vaccination 05/25/2020   PFIZER(Purple Top)SARS-COV-2 Vaccination 07/29/2019, 08/19/2019, 04/16/2021   Pertinent  Health Maintenance Due  Topic Date Due   PAP SMEAR-Modifier  Never done   INFLUENZA VACCINE  02/14/2022      01/06/2021    9:25 AM 04/21/2021    3:20 PM 08/04/2021    8:28 AM 02/01/2022    2:23  PM 08/15/2022    2:48 PM  Fall Risk  Falls in the past year? 0 0 0 0 0  Was there an injury with Fall? 0 0 0 0 0  Fall Risk Category Calculator 0 0 0 0 0  Fall Risk Category (Retired) Low Low Low Low   (RETIRED) Patient Fall Risk Level Low fall risk Low fall risk Low fall risk Low fall risk   Patient at Risk for Falls Due to No Fall Risks No Fall Risks No Fall  Risks No Fall Risks No Fall Risks  Fall risk Follow up Falls evaluation completed;Education provided;Falls prevention discussed Falls evaluation completed;Education provided;Falls prevention discussed Falls evaluation completed;Education provided;Falls prevention discussed Falls evaluation completed Falls evaluation completed   Functional Status Survey:    Vitals:   08/15/22 1444  BP: 100/70  Pulse: 70  Resp: 16  Temp: (!) 97.5 F (36.4 C)  SpO2: 98%  Weight: 148 lb 12.8 oz (67.5 kg)  Height: 5' 6.5" (1.689 m)   Body mass index is 23.66 kg/m. Physical Exam Vitals reviewed.  Constitutional:      General: She is not in acute distress.    Appearance: Normal appearance. She is normal weight. She is not ill-appearing or diaphoretic.  HENT:     Head: Normocephalic.     Right Ear: Ear canal and external ear normal. There is no impacted cerumen.     Left Ear: Drainage present. No swelling. A middle ear effusion is present. Tympanic membrane is not scarred or perforated.     Nose: Nose normal. No congestion or rhinorrhea.     Mouth/Throat:     Mouth: Mucous membranes are moist.     Pharynx: Oropharynx is clear. No oropharyngeal exudate or posterior oropharyngeal erythema.  Eyes:     General: No scleral icterus.       Right eye: No discharge.        Left eye: No discharge.     Extraocular Movements: Extraocular movements intact.     Conjunctiva/sclera: Conjunctivae normal.     Pupils: Pupils are equal, round, and reactive to light.  Neck:     Vascular: No carotid bruit.  Cardiovascular:     Rate and Rhythm: Normal rate and regular rhythm.     Pulses: Normal pulses.     Heart sounds: Normal heart sounds. No murmur heard.    No friction rub. No gallop.  Pulmonary:     Effort: Pulmonary effort is normal. No respiratory distress.     Breath sounds: Normal breath sounds. No wheezing, rhonchi or rales.  Chest:     Chest wall: No tenderness.  Abdominal:     General: Bowel sounds  are normal. There is no distension.     Palpations: Abdomen is soft. There is no mass.     Tenderness: There is no abdominal tenderness. There is no right CVA tenderness, left CVA tenderness, guarding or rebound.  Musculoskeletal:        General: No swelling or tenderness. Normal range of motion.     Cervical back: Normal range of motion. No rigidity or tenderness.     Right lower leg: No edema.     Left lower leg: No edema.  Lymphadenopathy:     Cervical: No cervical adenopathy.  Skin:    General: Skin is warm and dry.     Coloration: Skin is not pale.     Findings: No bruising, erythema, lesion or rash.  Neurological:     Mental Status: She  is alert and oriented to person, place, and time.     Cranial Nerves: No cranial nerve deficit.     Sensory: No sensory deficit.     Motor: No weakness.     Coordination: Coordination normal.     Gait: Gait normal.  Psychiatric:        Mood and Affect: Mood normal.        Speech: Speech normal.        Behavior: Behavior normal.    Labs reviewed: No results for input(s): "NA", "K", "CL", "CO2", "GLUCOSE", "BUN", "CREATININE", "CALCIUM", "MG", "PHOS" in the last 8760 hours. No results for input(s): "AST", "ALT", "ALKPHOS", "BILITOT", "PROT", "ALBUMIN" in the last 8760 hours. No results for input(s): "WBC", "NEUTROABS", "HGB", "HCT", "MCV", "PLT" in the last 8760 hours. No results found for: "TSH" No results found for: "HGBA1C" Lab Results  Component Value Date   CHOL 115 08/13/2020   HDL 39 (L) 08/13/2020   LDLCALC 64 08/13/2020   TRIG 50 08/13/2020   CHOLHDL 2.9 08/13/2020    Significant Diagnostic Results in last 30 days:  No results found.  Assessment/Plan  1. Non-recurrent acute serous otitis media of left ear Afebrile ,non-tender Left ear small amounts of serous drainage noted  -Start on Augmentin as below.Side effects discussed also advised to take probiotic or eat [place] yogurt to prevent antibiotic associated diarrhea. -  amoxicillin-clavulanate (AUGMENTIN) 875-125 MG tablet; Take 1 tablet by mouth 2 (two) times daily for 7 days.  Dispense: 14 tablet; Refill: 0  2. Sore throat - Advised to gurgle throat with warm water and salt at least once daily  - take OTC tylenol as needed for pain  - Encouraged to increase fluid intake/warm tea or soup  - Notify provider if symptoms worsen or fail to improve - amoxicillin-clavulanate (AUGMENTIN) 875-125 MG tablet; Take 1 tablet by mouth 2 (two) times daily for 7 days.  Dispense: 14 tablet; Refill: 0  Family/ staff Communication: Reviewed plan of care with patient verbalized understanding  Labs/tests ordered: None   Next Appointment: Return if symptoms worsen or fail to improve.    Sandrea Hughs, NP

## 2022-08-15 NOTE — Patient Instructions (Signed)
_ Take Over the counter tylenol as needed for pain or fever  - Notify provider if symptoms worsen or fail to improve

## 2022-08-17 ENCOUNTER — Other Ambulatory Visit: Payer: Self-pay | Admitting: Family

## 2022-08-17 MED ORDER — CIPROFLOXACIN-DEXAMETHASONE 0.3-0.1 % OT SUSP: 4.0000 [drp] | Freq: Two times a day (BID) | OTIC | 0 refills | Status: AC

## 2022-08-17 NOTE — Telephone Encounter (Signed)
Patient also called and left message on Clinical intake stating the same and requesting a Different Antibiotic And/Or Steroid   Stated that she has been on Augmentin for 4 days with no relief.   Please Advise.

## 2022-08-17 NOTE — Progress Notes (Signed)
Ciprodex 0.3 % otic solution instill 4 drops into each ear  twice daily x 7 days prescription send to pharmacy.

## 2022-08-17 NOTE — Progress Notes (Signed)
Replied Dinah's message back to patient's April Juarez.

## 2022-10-25 ENCOUNTER — Other Ambulatory Visit (HOSPITAL_BASED_OUTPATIENT_CLINIC_OR_DEPARTMENT_OTHER): Payer: Self-pay

## 2022-10-25 DIAGNOSIS — L821 Other seborrheic keratosis: Secondary | ICD-10-CM | POA: Diagnosis not present

## 2022-10-25 DIAGNOSIS — L700 Acne vulgaris: Secondary | ICD-10-CM | POA: Diagnosis not present

## 2022-10-25 DIAGNOSIS — L408 Other psoriasis: Secondary | ICD-10-CM | POA: Diagnosis not present

## 2022-10-25 DIAGNOSIS — D225 Melanocytic nevi of trunk: Secondary | ICD-10-CM | POA: Diagnosis not present

## 2022-10-25 DIAGNOSIS — L400 Psoriasis vulgaris: Secondary | ICD-10-CM | POA: Diagnosis not present

## 2022-10-25 DIAGNOSIS — L814 Other melanin hyperpigmentation: Secondary | ICD-10-CM | POA: Diagnosis not present

## 2022-10-25 MED ORDER — CLINDAMYCIN PHOSPHATE 1 % EX SWAB
CUTANEOUS | 1 refills | Status: DC
  Filled 2022-10-25: qty 60, 30d supply, fill #0
  Filled 2023-01-29: qty 60, 30d supply, fill #1

## 2023-02-07 ENCOUNTER — Other Ambulatory Visit (HOSPITAL_BASED_OUTPATIENT_CLINIC_OR_DEPARTMENT_OTHER): Payer: Self-pay

## 2023-02-07 DIAGNOSIS — L700 Acne vulgaris: Secondary | ICD-10-CM | POA: Diagnosis not present

## 2023-02-07 DIAGNOSIS — L400 Psoriasis vulgaris: Secondary | ICD-10-CM | POA: Diagnosis not present

## 2023-02-07 MED ORDER — TRETINOIN 0.05 % EX CREA
TOPICAL_CREAM | CUTANEOUS | 3 refills | Status: AC
  Filled 2023-02-07: qty 45, 30d supply, fill #0
  Filled 2023-03-18: qty 45, 30d supply, fill #1
  Filled 2023-06-29: qty 45, 30d supply, fill #2
  Filled 2023-08-19: qty 45, 30d supply, fill #3

## 2023-02-13 ENCOUNTER — Other Ambulatory Visit (HOSPITAL_BASED_OUTPATIENT_CLINIC_OR_DEPARTMENT_OTHER): Payer: Self-pay

## 2023-02-13 MED ORDER — SPIRONOLACTONE 25 MG PO TABS
ORAL_TABLET | ORAL | 4 refills | Status: AC
  Filled 2023-02-13: qty 60, 37d supply, fill #0
  Filled 2023-03-18: qty 60, 37d supply, fill #1
  Filled 2023-04-21: qty 60, 30d supply, fill #2
  Filled 2023-05-22: qty 60, 30d supply, fill #3
  Filled 2023-06-29 – 2023-08-17 (×3): qty 60, 30d supply, fill #4

## 2023-02-26 ENCOUNTER — Encounter: Payer: Self-pay | Admitting: Orthopedic Surgery

## 2023-02-26 ENCOUNTER — Other Ambulatory Visit: Payer: Self-pay | Admitting: Orthopedic Surgery

## 2023-02-26 DIAGNOSIS — J339 Nasal polyp, unspecified: Secondary | ICD-10-CM

## 2023-02-26 DIAGNOSIS — H9193 Unspecified hearing loss, bilateral: Secondary | ICD-10-CM

## 2023-03-19 ENCOUNTER — Other Ambulatory Visit (HOSPITAL_BASED_OUTPATIENT_CLINIC_OR_DEPARTMENT_OTHER): Payer: Self-pay

## 2023-03-27 ENCOUNTER — Other Ambulatory Visit (HOSPITAL_BASED_OUTPATIENT_CLINIC_OR_DEPARTMENT_OTHER): Payer: Self-pay

## 2023-03-27 ENCOUNTER — Ambulatory Visit: Payer: 59 | Admitting: Family

## 2023-03-27 VITALS — BP 120/80 | HR 76 | Temp 97.6°F | Resp 12 | Ht 66.5 in | Wt 146.0 lb

## 2023-03-27 DIAGNOSIS — J0101 Acute recurrent maxillary sinusitis: Secondary | ICD-10-CM

## 2023-03-27 MED ORDER — PREDNISONE 20 MG PO TABS
ORAL_TABLET | ORAL | 0 refills | Status: AC
  Filled 2023-03-27: qty 5, 4d supply, fill #0

## 2023-03-27 MED ORDER — AZITHROMYCIN 250 MG PO TABS
ORAL_TABLET | ORAL | 0 refills | Status: DC
Start: 2023-03-27 — End: 2023-08-23
  Filled 2023-03-27: qty 6, 5d supply, fill #0

## 2023-03-27 NOTE — Progress Notes (Signed)
Provider: Paige Monarrez FNP-C  Octavia Heir, NP  Patient Care Team: Octavia Heir, NP as PCP - General (Adult Health Nurse Practitioner) Janalyn Harder, MD (Inactive) as Consulting Physician (Dermatology)  Extended Emergency Contact Information Primary Emergency Contact: Duayne Cal Address: [address]          [place], [place] [address] Darden Amber of [place] Mobile Phone: [phone] Relation: Spouse  Code Status: Full code  Goals of care: Advanced Directive information    03/27/2023    1:49 PM  Advanced Directives  Does Patient Have a Medical Advance Directive? No     Chief Complaint  Patient presents with   Acute Visit    Sinus congestion, some ear itching    HPI:  Pt is a 42 y.o. female seen today for an acute visit for evaluation of cough and congestion.States her children and Husband have had cold symptoms. Had a cold three weeks ago then improved then started having  runny nose,cough,sore throat,fatigue. Appetite good.  He denies any fever or chills.   No past medical history on file. Past Surgical History:  Procedure Laterality Date   DILATION AND CURETTAGE OF UTERUS  2008   TONSILLECTOMY AND ADENOIDECTOMY  2000    Allergies  Allergen Reactions   Sulfa Antibiotics Hives   Xyzal [Levocetirizine]     "Felt drugged"   Triamcinolone Dermatitis and Rash    Outpatient Encounter Medications as of 03/27/2023  Medication Sig   guaiFENesin (MUCINEX) 600 MG 12 hr tablet Take 600 mg by mouth 2 (two) times daily.   IBUPROFEN PO Take by mouth. 600-800 mg 1 to 2 times a week   Multiple Vitamins-Minerals (WOMENS MULTIVITAMIN PO) Take by mouth daily.   Omega-3 Fatty Acids (FISH OIL PO) Take by mouth daily.   Phenylephrine-Acetaminophen 10-650 MG PACK    Probiotic Product (PROBIOTIC-10 PO) Probiotic  1 TAB, DAILY   spironolactone (ALDACTONE) 25 MG tablet Take 1 tablet (25 mg total) by mouth daily for 14 days, THEN 2 tablets (50 mg total) daily.   tretinoin  (RETIN-A) 0.05 % cream Apply to face 3 nights a week, increasing to nightly as tolerated   VITAMIN D, CHOLECALCIFEROL, PO Take by mouth.   [DISCONTINUED] clindamycin (CLEOCIN T) 1 % lotion Apply 1 Application topically 2 (two) times daily as needed.   [DISCONTINUED] clindamycin (CLINDACIN ETZ) 1 % SWAB Apply to face each morning.   No facility-administered encounter medications on file as of 03/27/2023.    Review of Systems  Constitutional:  Positive for fatigue. Negative for appetite change, chills, fever and unexpected weight change.  HENT:  Positive for congestion, rhinorrhea and sore throat. Negative for dental problem, ear discharge, ear pain, facial swelling, hearing loss, nosebleeds, postnasal drip, sinus pressure, sinus pain, sneezing, tinnitus and trouble swallowing.   Eyes:  Negative for pain, discharge, redness, itching and visual disturbance.  Respiratory:  Positive for cough. Negative for chest tightness, shortness of breath and wheezing.   Cardiovascular:  Negative for chest pain, palpitations and leg swelling.  Gastrointestinal:  Negative for abdominal distention, abdominal pain, blood in stool, constipation, diarrhea, nausea and vomiting.  Skin:  Negative for color change, pallor and rash.  Neurological:  Negative for dizziness, light-headedness and headaches.    Immunization History  Administered Date(s) Administered   DTaP 07/18/2019   Influenza-Unspecified 04/16/2020, 04/08/2021   Moderna SARS-COV2 Booster Vaccination 05/25/2020   PFIZER(Purple Top)SARS-COV-2 Vaccination 07/29/2019, 08/19/2019, 04/16/2021   Pertinent  Health Maintenance Due  Topic Date Due  PAP SMEAR-Modifier  Never done   INFLUENZA VACCINE  02/15/2023      01/06/2021    9:25 AM 04/21/2021    3:20 PM 08/04/2021    8:28 AM 02/01/2022    2:23 PM 08/15/2022    2:48 PM  Fall Risk  Falls in the past year? 0 0 0 0 0  Was there an injury with Fall? 0 0 0 0 0  Fall Risk Category Calculator 0 0 0 0 0   Fall Risk Category (Retired) Low Low Low Low   (RETIRED) Patient Fall Risk Level Low fall risk Low fall risk Low fall risk Low fall risk   Patient at Risk for Falls Due to No Fall Risks No Fall Risks No Fall Risks No Fall Risks No Fall Risks  Fall risk Follow up Falls evaluation completed;Education provided;Falls prevention discussed Falls evaluation completed;Education provided;Falls prevention discussed Falls evaluation completed;Education provided;Falls prevention discussed Falls evaluation completed Falls evaluation completed   Functional Status Survey:    Vitals:   03/27/23 1350  BP: 120/80  Pulse: 76  Resp: 12  Temp: 97.6 F (36.4 C)  SpO2: 98%  Weight: 146 lb (66.2 kg)  Height: 5' 6.5" (1.689 m)   Body mass index is 23.21 kg/m. Physical Exam Vitals reviewed.  Constitutional:      General: She is not in acute distress.    Appearance: Normal appearance. She is normal weight. She is not ill-appearing or diaphoretic.  HENT:     Head: Normocephalic.     Right Ear: Tympanic membrane, ear canal and external ear normal. There is no impacted cerumen.     Left Ear: Tympanic membrane, ear canal and external ear normal. There is no impacted cerumen.     Nose: Congestion and rhinorrhea present.     Right Turbinates: Not enlarged, swollen or pale.     Left Turbinates: Not enlarged, swollen or pale.     Right Sinus: Maxillary sinus tenderness present. No frontal sinus tenderness.     Left Sinus: Maxillary sinus tenderness present. No frontal sinus tenderness.     Mouth/Throat:     Mouth: Mucous membranes are moist.     Pharynx: Oropharynx is clear. No oropharyngeal exudate or posterior oropharyngeal erythema.  Eyes:     General: No scleral icterus.       Right eye: No discharge.        Left eye: No discharge.     Extraocular Movements: Extraocular movements intact.     Conjunctiva/sclera: Conjunctivae normal.     Pupils: Pupils are equal, round, and reactive to light.  Neck:      Vascular: No carotid bruit.  Cardiovascular:     Rate and Rhythm: Normal rate and regular rhythm.     Pulses: Normal pulses.     Heart sounds: Normal heart sounds. No murmur heard.    No friction rub. No gallop.  Pulmonary:     Effort: Pulmonary effort is normal. No respiratory distress.     Breath sounds: Normal breath sounds. No wheezing, rhonchi or rales.  Chest:     Chest wall: No tenderness.  Musculoskeletal:     Cervical back: Normal range of motion. No rigidity or tenderness.  Lymphadenopathy:     Cervical: No cervical adenopathy.  Skin:    General: Skin is warm and dry.     Coloration: Skin is not pale.     Findings: No erythema or rash.  Neurological:     Mental Status: She is alert and  oriented to person, place, and time.     Cranial Nerves: No cranial nerve deficit.     Sensory: No sensory deficit.     Motor: No weakness.     Gait: Gait normal.  Psychiatric:        Mood and Affect: Mood normal.        Speech: Speech normal.        Behavior: Behavior normal.     Labs reviewed: No results for input(s): "NA", "K", "CL", "CO2", "GLUCOSE", "BUN", "CREATININE", "CALCIUM", "MG", "PHOS" in the last 8760 hours. No results for input(s): "AST", "ALT", "ALKPHOS", "BILITOT", "PROT", "ALBUMIN" in the last 8760 hours. No results for input(s): "WBC", "NEUTROABS", "HGB", "HCT", "MCV", "PLT" in the last 8760 hours. No results found for: "TSH" No results found for: "HGBA1C" Lab Results  Component Value Date   CHOL 115 08/13/2020   HDL 39 (L) 08/13/2020   LDLCALC 64 08/13/2020   TRIG 50 08/13/2020   CHOLHDL 2.9 08/13/2020    Significant Diagnostic Results in last 30 days:  No results found.  Assessment/Plan   Acute recurrent maxillary sinusitis Afebrile  - Bilateral maxillary tender to palpation  - start on Azithromycin  - azithromycin (ZITHROMAX) 250 MG tablet; Take 2 tablets (500 mg ) by mouth x 1 dose then one tablet ( 250 mg ) by mouth daily x 4 days  Dispense: 6  tablet; Refill: 0  Family/ staff Communication: Reviewed plan of care with patient verbalized understanding   Labs/tests ordered: None   Next Appointment: Return if symptoms worsen or fail to improve.   Caesar Bookman, NP

## 2023-04-04 ENCOUNTER — Other Ambulatory Visit (HOSPITAL_BASED_OUTPATIENT_CLINIC_OR_DEPARTMENT_OTHER): Payer: Self-pay

## 2023-04-04 ENCOUNTER — Other Ambulatory Visit: Payer: Self-pay | Admitting: Adult Health

## 2023-04-04 DIAGNOSIS — Z1231 Encounter for screening mammogram for malignant neoplasm of breast: Secondary | ICD-10-CM | POA: Diagnosis not present

## 2023-04-04 DIAGNOSIS — B3731 Acute candidiasis of vulva and vagina: Secondary | ICD-10-CM | POA: Diagnosis not present

## 2023-04-04 DIAGNOSIS — Z130 Encounter for screening for diseases of the blood and blood-forming organs and certain disorders involving the immune mechanism: Secondary | ICD-10-CM | POA: Diagnosis not present

## 2023-04-04 DIAGNOSIS — Z01419 Encounter for gynecological examination (general) (routine) without abnormal findings: Secondary | ICD-10-CM | POA: Diagnosis not present

## 2023-04-04 DIAGNOSIS — Z1389 Encounter for screening for other disorder: Secondary | ICD-10-CM | POA: Diagnosis not present

## 2023-04-04 LAB — HM MAMMOGRAPHY

## 2023-04-04 MED ORDER — FLUCONAZOLE 150 MG PO TABS
150.0000 mg | ORAL_TABLET | Freq: Every day | ORAL | 1 refills | Status: DC
  Filled 2023-04-04: qty 1, 1d supply, fill #0
  Filled 2023-04-05: qty 1, 1d supply, fill #1

## 2023-04-21 ENCOUNTER — Other Ambulatory Visit (HOSPITAL_BASED_OUTPATIENT_CLINIC_OR_DEPARTMENT_OTHER): Payer: Self-pay

## 2023-05-03 ENCOUNTER — Encounter: Payer: 59 | Admitting: Orthopedic Surgery

## 2023-05-15 ENCOUNTER — Other Ambulatory Visit (HOSPITAL_BASED_OUTPATIENT_CLINIC_OR_DEPARTMENT_OTHER): Payer: Self-pay

## 2023-05-15 ENCOUNTER — Encounter (INDEPENDENT_AMBULATORY_CARE_PROVIDER_SITE_OTHER): Payer: Self-pay

## 2023-05-15 ENCOUNTER — Ambulatory Visit (INDEPENDENT_AMBULATORY_CARE_PROVIDER_SITE_OTHER): Payer: 59 | Admitting: Otolaryngology

## 2023-05-15 ENCOUNTER — Ambulatory Visit (INDEPENDENT_AMBULATORY_CARE_PROVIDER_SITE_OTHER): Payer: 59 | Admitting: Audiology

## 2023-05-15 VITALS — Ht 66.0 in | Wt 146.0 lb

## 2023-05-15 DIAGNOSIS — Z0110 Encounter for examination of ears and hearing without abnormal findings: Secondary | ICD-10-CM

## 2023-05-15 DIAGNOSIS — H938X3 Other specified disorders of ear, bilateral: Secondary | ICD-10-CM | POA: Diagnosis not present

## 2023-05-15 DIAGNOSIS — J0181 Other acute recurrent sinusitis: Secondary | ICD-10-CM

## 2023-05-15 DIAGNOSIS — J3489 Other specified disorders of nose and nasal sinuses: Secondary | ICD-10-CM | POA: Diagnosis not present

## 2023-05-15 DIAGNOSIS — H608X3 Other otitis externa, bilateral: Secondary | ICD-10-CM

## 2023-05-15 MED ORDER — AZELASTINE HCL 0.1 % NA SOLN
2.0000 | Freq: Two times a day (BID) | NASAL | 12 refills | Status: DC
  Filled 2023-05-15: qty 30, 50d supply, fill #0
  Filled 2023-06-29: qty 30, 50d supply, fill #1
  Filled 2023-08-20: qty 30, 50d supply, fill #2

## 2023-05-15 MED ORDER — FLUTICASONE PROPIONATE 50 MCG/ACT NA SUSP
1.0000 | Freq: Every day | NASAL | 6 refills | Status: DC
  Filled 2023-05-15: qty 16, 60d supply, fill #0
  Filled 2023-07-24: qty 16, 60d supply, fill #1
  Filled 2023-08-20: qty 16, 60d supply, fill #2

## 2023-05-15 MED ORDER — BETAMETHASONE DIPROPIONATE 0.05 % EX CREA
TOPICAL_CREAM | Freq: Two times a day (BID) | CUTANEOUS | 0 refills | Status: AC
  Filled 2023-05-15: qty 45, 90d supply, fill #0

## 2023-05-15 MED ORDER — CIPROFLOXACIN-DEXAMETHASONE 0.3-0.1 % OT SUSP
4.0000 [drp] | Freq: Two times a day (BID) | OTIC | 0 refills | Status: AC
  Filled 2023-05-15: qty 7.5, 14d supply, fill #0

## 2023-05-15 NOTE — Progress Notes (Unsigned)
Dear Dr. Coletta Memos, Here is my assessment for our mutual patient, April Juarez. Thank you for allowing me the opportunity to care for your patient. Please do not hesitate to contact me should you have any other questions. Sincerely, Dr. Jovita Kussmaul  Otolaryngology Clinic Note Referring provider: Dr. Coletta Memos HPI:  April Juarez is a 42 y.o. female kindly referred by Dr. Coletta Memos for evaluation of multiple complaints: Hearing loss - Usually feeling of fluid bilaterally, and some ear fullness. Fair amount of ear itchiness - lots of it, including currently. Some waxy drainage. No vertigo. No vestibular suppressants, barotrauma, no prior ear surgery. Irrigated prior and may have caused TM rupture? Derm diagnosed her with eczema around ears - steroid cream helped significantly with all sx. Did some ciprodex ear drops - helped some as well. Recurrent sinusitis - Getting about 2-4 ear infections/year.  Coupled with sinus infections - 2-4/year. Lot of pressure (max, frontal), lightheaded, anterior rhinorrhea/PND (mostly PND; yellow/greenish), and congestion. Hyposmia during infection, but not otherwise. She feels like sinuses recover virtually completely afterwards.  Does get antibiotics for this - Augmentin Jan; fall had Z-pak and steroid taper. It does help.   Currently uses claritin and flonase as needed. Saline rinses PRN (Neti pot). Never tried astelin. No allergy testing or CT sinuses.   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 No pertinent past medical history.   Past Surgical History:  Procedure Laterality Date   DILATION AND CURETTAGE OF UTERUS  2008   TONSILLECTOMY AND ADENOIDECTOMY  2000    Family History  Problem Relation Age of Onset   Macular degeneration Mother    Dementia Maternal Grandmother    Cancer Maternal Grandmother    Stroke Maternal Grandfather    Heart attack Maternal Grandfather    Cancer Paternal Grandmother    Stroke Paternal Grandmother    Stroke Paternal Grandfather    Heart attack Paternal Grandfather    Breast cancer Neg Hx      Social Connections: Moderately Integrated (04/29/2023)   Social Connection and Isolation Panel [NHANES]    Frequency of Communication with Friends and Family: Three times a week    Frequency of Social Gatherings with Friends and Family: More than three times a week    Attends Religious Services: More than 4 times per year    Active Member of [employer] or Organizations: No    Attends Engineer, structural: Not on file    Marital Status: Married    Tobacco: denies  Current Outpatient Medications:    fluticasone (FLONASE) 50 MCG/ACT nasal spray, Place 1 spray into both nostrils daily., Disp: , Rfl:    IBUPROFEN PO, Take by mouth. 600-800 mg 1 to 2 times a week, Disp: , Rfl:    Loratadine (CLARITIN) 10 MG CAPS, Take 10 mg by mouth daily., Disp: , Rfl:    Multiple Vitamins-Minerals (WOMENS MULTIVITAMIN PO), Take by mouth daily., Disp: , Rfl:    Omega-3 Fatty Acids (FISH OIL PO), Take by mouth daily., Disp: , Rfl:    Probiotic Product (PROBIOTIC-10 PO),  Probiotic  1 TAB, DAILY, Disp: , Rfl:    spironolactone (ALDACTONE) 25 MG tablet, Take 1 tablet (25 mg total) by mouth daily for 14 days, THEN 2 tablets (50 mg total) daily., Disp: 60 tablet, Rfl: 4   tretinoin (RETIN-A) 0.05 % cream, Apply to face 3 nights a week, increasing to nightly as tolerated, Disp: 45 g, Rfl: 3   VITAMIN D, CHOLECALCIFEROL, PO, Take by mouth., Disp: , Rfl:     VTAMA 1 % CREA, Apply 1 % topically 3 times/day as needed-between meals & bedtime., Disp: , Rfl:    azithromycin (ZITHROMAX) 250 MG tablet, Take 2 tablets (500 mg ) by mouth x 1 dose then one tablet ( 250 mg ) by mouth daily x 4 days, Disp: 6 tablet, Rfl: 0   fluconazole (DIFLUCAN) 150 MG tablet, Take 1 tablet (150 mg total) by mouth daily for 1 day., Disp: 1 tablet, Rfl: 1   guaiFENesin (MUCINEX) 600 MG 12 hr tablet, Take 600 mg by mouth 2 (two) times daily., Disp: , Rfl:    Phenylephrine-Acetaminophen 10-650 MG PACK, , Disp: , Rfl:    Physical Exam:   Ht 5\' 6"  (1.676 m)   Wt 146 lb (66.2 kg)   BMI 23.57 kg/m    Salient findings:  CN II-XII intact Bilateral EAC clear and TM intact with well pneumatized middle ear spaces; ear canals cartilaginous very narrow; unable to visualize anteriormost part of TM but otherwise well aerated, no drainage, no perforation; <37mm papillomatous lesion left nasal sill; no flaky skin currently Weber 512: midline;  [address]inne 512: AC > BC b/l Anterior rhinoscopy: Septum modestly deviation; bilateral inferior turbinates with mild hypertrophy No lesions of oral cavity/oropharynx; dentition fair No obviously palpable neck masses/lymphadenopathy/thyromegaly No respiratory distress or stridor  Procedures:  PROCEDURE: Bilateral Diagnostic Rigid Nasal Endoscopy Pre-procedure diagnosis: Concern for sinusitis; nasal lesion Post-procedure diagnosis: same Indication: See pre-procedure diagnosis and physical exam above Complications: None apparent EBL: 0 mL Anesthesia: Lidocaine 4% and topical decongestant was topically sprayed in each nasal cavity  Description of Procedure:  Patient was identified. A rigid 0 degree endoscope was utilized to evaluate the sinonasal cavities, mucosa, sinus ostia and turbinates and septum.  Overall, signs of mucosal inflammation are mild. No mucopurulence, polyps, or masses noted. Nasal cavity modest dryness Right Middle meatus:  clear Right SE Recess: clear Left MM: clear Left SE Recess: clear     Photodocumentation was obtained.  CPT CODE -- 82956 - Mod 25   Impression & Plans:  April Juarez is a 42 y.o. female with multiple complaints: Bilateral ear fullness and itching: most likely related to eczematoid OE. No significant trouble right now, so will observe.  - Will do bethamethasone ointment and ciprodex (see below) - prescribed - if symptoms recur - audio wnl essentially, which is reassuring 2.  Bilateral recurrent acute sinusitis -  - 2-4/year, improved with abx. Currently on claritin and flonase as needed and saline rinses. We discussed her endoscopy - We discussed options including full medical management and post-treatment sinus CT. Will hold off for now per patient preference. Will start with sprays and good nasal regimen and escalate as needed - Flonase BID daily - Astelin BID daily - Daily sinus rinses  Meds ordered this encounter  Medications   betamethasone dipropionate 0.05 % cream    Sig: Apply topically to outside of ear (concha bowl) twice per day for 7 days in case of ear itching    Dispense:  45 g  Refill:  0   fluticasone (FLONASE) 50 MCG/ACT nasal spray    Sig: Place 1 spray into both nostrils daily.    Dispense:  16 g    Refill:  6   azelastine (ASTELIN) 0.1 % nasal spray    Sig: Place 2 sprays into both nostrils 2 (two) times daily. Use in each nostril as directed    Dispense:  30 mL    Refill:  12   ciprofloxacin-dexamethasone (CIPRODEX) OTIC suspension    Sig: Place 4 drops into both ears 2 (two) times daily for 10 days. In case of ear itching    Dispense:  7.5 mL    Refill:  0    F/u 6 months  MDM:  Level 4: 99204 Complexity/Problems addressed: mod Data complexity: mod - independent interpretation of audio/tests - Morbidity: mod - Prescription Drug prescribed or managed: yes     Thank you for allowing me the opportunity to care for your patient. Please  do not hesitate to contact me should you have any other questions.  Sincerely, Jovita Kussmaul, MD Otolarynoglogist (ENT), [facility] Phone: [phone] Fax: [phone]  05/15/2023, 8:52 AM

## 2023-05-15 NOTE — Progress Notes (Signed)
  [address], [address] [place], [place] [address] [phone]  Audiological Evaluation    Name: April Juarez     DOB:   10-28-1980      MRN:   [id number]                                                                                     Service Date: 05/15/2023        Patient was referred today for a hearing evaluation by Dr. Allena Katz.  Symptoms Yes Details  Hearing loss  [x]  Patient reported perceiving hearing loss intermittently in both ears.  Tinnitus  []  Patient denied experiencing tinnitus.  Ear pain/ Ear infections  [x]  Patient reported multiple ear infections yearly.  Balance problems  [x]  Patient reported intermittent vertigo/imbalance sensations.  Previous ear surgeries  []  Patient denied any previous ear surgeries.  Family history  []  Patient denied family history of hearing loss.  Amplification  []  Patient denied the use of hearing aids.    Tympanogram: Right ear: Could not obtain seal; middle ear status is unable to be determined at this time. Left ear: Could not obtain seal; middle ear status is unable to be determined at this time.    Hearing Evaluation: The audiogram was completed using conventional audiometric techniques under headphones with good reliability.   The hearing test results indicate: Right ear: Normal hearing sensitivity from [phone] Hz. Left ear: Normal hearing sensitivity from 740-250-0005 Hz sloping to mild hearing loss at 8000 Hz.   Speech Audiometry: Right ear- Speech Reception Threshold (SRT) was obtained at 10 dBHL. Left ear- Speech Reception Threshold (SRT) was obtained at 10 dBHL.   Word Recognition Score Tested using NU-6 (MLV) Right ear: 100% was obtained at a presentation level of 50 dBHL which is deemed as excellent understanding. Left ear: 100% was obtained at a presentation level of 50 dBHL which is deemed as excellent understanding.    Impression:  There is not a significant difference between puretone thresholds and  word recognition scores between ears.   Recommendations: Repeat audiogram when changes are perceived or per MD.   April Juarez, AUD, CCC-A 05/15/23

## 2023-05-22 ENCOUNTER — Other Ambulatory Visit (HOSPITAL_BASED_OUTPATIENT_CLINIC_OR_DEPARTMENT_OTHER): Payer: Self-pay

## 2023-06-13 ENCOUNTER — Other Ambulatory Visit (HOSPITAL_BASED_OUTPATIENT_CLINIC_OR_DEPARTMENT_OTHER): Payer: Self-pay

## 2023-06-13 MED ORDER — AZELAIC ACID 15 % EX GEL
CUTANEOUS | 2 refills | Status: AC
  Filled 2023-06-13: qty 50, 30d supply, fill #0
  Filled 2023-07-25: qty 50, 30d supply, fill #1
  Filled 2023-08-20: qty 50, 30d supply, fill #2

## 2023-06-13 MED ORDER — SPIRONOLACTONE 25 MG PO TABS
25.0000 mg | ORAL_TABLET | Freq: Two times a day (BID) | ORAL | 6 refills | Status: AC
  Filled 2023-06-13 – 2023-06-15 (×2): qty 60, 30d supply, fill #0
  Filled 2023-07-24: qty 60, 30d supply, fill #1
  Filled 2023-08-19 – 2023-09-26 (×3): qty 60, 30d supply, fill #2
  Filled 2023-11-07: qty 60, 30d supply, fill #3
  Filled 2023-12-26: qty 60, 30d supply, fill #4
  Filled 2024-01-27: qty 60, 30d supply, fill #5
  Filled 2024-02-28: qty 60, 30d supply, fill #6

## 2023-06-15 ENCOUNTER — Other Ambulatory Visit (HOSPITAL_BASED_OUTPATIENT_CLINIC_OR_DEPARTMENT_OTHER): Payer: Self-pay

## 2023-06-29 ENCOUNTER — Other Ambulatory Visit (HOSPITAL_BASED_OUTPATIENT_CLINIC_OR_DEPARTMENT_OTHER): Payer: Self-pay

## 2023-06-29 ENCOUNTER — Other Ambulatory Visit: Payer: Self-pay

## 2023-07-24 ENCOUNTER — Other Ambulatory Visit (HOSPITAL_BASED_OUTPATIENT_CLINIC_OR_DEPARTMENT_OTHER): Payer: Self-pay

## 2023-07-25 ENCOUNTER — Other Ambulatory Visit (HOSPITAL_BASED_OUTPATIENT_CLINIC_OR_DEPARTMENT_OTHER): Payer: Self-pay

## 2023-07-26 ENCOUNTER — Other Ambulatory Visit: Payer: Self-pay

## 2023-07-26 ENCOUNTER — Other Ambulatory Visit (HOSPITAL_BASED_OUTPATIENT_CLINIC_OR_DEPARTMENT_OTHER): Payer: Self-pay

## 2023-08-09 ENCOUNTER — Encounter: Payer: 59 | Admitting: Orthopedic Surgery

## 2023-08-16 ENCOUNTER — Ambulatory Visit: Payer: Commercial Managed Care - PPO | Admitting: Orthopedic Surgery

## 2023-08-17 ENCOUNTER — Other Ambulatory Visit: Payer: Self-pay

## 2023-08-19 ENCOUNTER — Other Ambulatory Visit (HOSPITAL_BASED_OUTPATIENT_CLINIC_OR_DEPARTMENT_OTHER): Payer: Self-pay

## 2023-08-20 ENCOUNTER — Other Ambulatory Visit (HOSPITAL_BASED_OUTPATIENT_CLINIC_OR_DEPARTMENT_OTHER): Payer: Self-pay

## 2023-08-20 ENCOUNTER — Other Ambulatory Visit: Payer: Self-pay

## 2023-08-23 ENCOUNTER — Encounter: Payer: Self-pay | Admitting: Orthopedic Surgery

## 2023-08-23 ENCOUNTER — Other Ambulatory Visit (HOSPITAL_BASED_OUTPATIENT_CLINIC_OR_DEPARTMENT_OTHER): Payer: Self-pay

## 2023-08-23 ENCOUNTER — Ambulatory Visit (INDEPENDENT_AMBULATORY_CARE_PROVIDER_SITE_OTHER): Payer: Commercial Managed Care - PPO | Admitting: Orthopedic Surgery

## 2023-08-23 VITALS — BP 110/60 | HR 80 | Temp 96.9°F | Resp 16 | Ht 66.0 in | Wt 143.4 lb

## 2023-08-23 DIAGNOSIS — Z1231 Encounter for screening mammogram for malignant neoplasm of breast: Secondary | ICD-10-CM | POA: Diagnosis not present

## 2023-08-23 DIAGNOSIS — Z0000 Encounter for general adult medical examination without abnormal findings: Secondary | ICD-10-CM

## 2023-08-23 NOTE — Progress Notes (Signed)
 Careteam: Patient Care Team: Gil Greig BRAVO, NP as PCP - General (Adult Health Nurse Practitioner) Livingston Rigg, MD (Inactive) as Consulting Physician (Dermatology) [facility]  Seen by: Greig Gil, AGNP-C  PLACE OF SERVICE:  [facility]  Advanced Directive information    Allergies  Allergen Reactions   Sulfa Antibiotics Hives   Xyzal [Levocetirizine]     Felt drugged   Triamcinolone  Dermatitis and Rash    Chief Complaint  Patient presents with   Annual Exam    PHYSICAL   Health Maintenance    Discuss the need for Pap smear.      HPI: Patient is a 43 y.o. female seen today for annual physical exam.   BMI 23.15.   BP 110/60.   No recent hospitalizations, surgeries or injuries.   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 Admits to stress raising 4 children and working. Stressors are manageable at this time.   Mammogram was done last year> unknown date. Last PAP unknown, followed by Dr. Sudie. Thinking about switching to [facility] provider.   Review of Systems:  Review of Systems  Constitutional: Negative.   HENT: Negative.    Eyes: Negative.   Respiratory: Negative.    Cardiovascular: Negative.   Gastrointestinal: Negative.   Genitourinary: Negative.   Musculoskeletal: Negative.   Skin: Negative.   Neurological: Negative.   Psychiatric/Behavioral: Negative.      No past medical history on file. Past Surgical History:  Procedure Laterality Date   DILATION AND CURETTAGE OF UTERUS  2008   TONSILLECTOMY AND ADENOIDECTOMY  2000   Social History:   reports that she has quit smoking. Her smoking use included cigarettes. She has a 3 pack-year smoking history. She has never used smokeless tobacco. She reports current alcohol use. She reports that she does not use drugs.  Family History  Problem Relation Age of Onset   Macular degeneration Mother    Dementia Maternal Grandmother    Cancer Maternal Grandmother    Stroke Maternal  Grandfather    Heart attack Maternal Grandfather    Cancer Paternal Grandmother    Stroke Paternal Grandmother    Stroke Paternal Grandfather    Heart attack Paternal Grandfather    Breast cancer Neg Hx     Medications: Patient's Medications  New Prescriptions   No medications on file  Previous Medications   AZELAIC ACID  15 % GEL    Apply to face daily   BETAMETHASONE  DIPROPIONATE 0.05 % CREAM    Apply topically to outside of ear (concha bowl) twice per day for 7 days in case of ear itching   FLUTICASONE  (FLONASE ) 50 MCG/ACT NASAL SPRAY    Place 1 spray into both nostrils daily.   IBUPROFEN PO    Take by mouth. 600-800 mg 1 to 2 times a week   LORATADINE (CLARITIN) 10 MG CAPS    Take 10 mg by mouth daily.   MULTIPLE VITAMINS-MINERALS (WOMENS MULTIVITAMIN PO)    Take by mouth daily.   OMEGA-3 FATTY ACIDS (FISH OIL PO)    Take by mouth daily.   PROBIOTIC PRODUCT (PROBIOTIC-10 PO)    Probiotic  1 TAB, DAILY   SPIRONOLACTONE  (ALDACTONE ) 25 MG TABLET    Take 1 tablet (25 mg total) by mouth daily for 14 days, THEN 2 tablets (50 mg total) daily.   SPIRONOLACTONE  (ALDACTONE ) 25 MG TABLET    Take 1 tablet (25 mg total) by mouth 2 (two) times daily.   TRETINOIN  (RETIN-A ) 0.05 % CREAM  Apply to face 3 nights a week, increasing to nightly as tolerated   VITAMIN D, CHOLECALCIFEROL, PO    Take by mouth.   VTAMA 1 % CREA    Apply 1 % topically 3 times/day as needed-between meals & bedtime.  Modified Medications   No medications on file  Discontinued Medications   AZELASTINE  (ASTELIN ) 0.1 % NASAL SPRAY    Place 2 sprays into both nostrils 2 (two) times daily. Use in each nostril as directed   AZITHROMYCIN  (ZITHROMAX ) 250 MG TABLET    Take 2 tablets (500 mg ) by mouth x 1 dose then one tablet ( 250 mg ) by mouth daily x 4 days   FLUCONAZOLE  (DIFLUCAN ) 150 MG TABLET    Take 1 tablet (150 mg total) by mouth daily for 1 day.   GUAIFENESIN (MUCINEX) 600 MG 12 HR TABLET    Take 600 mg by mouth 2 (two)  times daily.   PHENYLEPHRINE-ACETAMINOPHEN 10-650 MG PACK        Physical Exam:  Vitals:   08/23/23 1349  BP: 110/60  Pulse: 80  Resp: 16  Temp: (!) 96.9 F (36.1 C)  SpO2: 94%  Weight: 143 lb 6.4 oz (65 kg)  Height: 5' 6 (1.676 m)   Body mass index is 23.15 kg/m. Wt Readings from Last 3 Encounters:  08/23/23 143 lb 6.4 oz (65 kg)  05/15/23 146 lb (66.2 kg)  03/27/23 146 lb (66.2 kg)    Physical Exam Vitals reviewed.  Constitutional:      General: She is not in acute distress. HENT:     Head: Normocephalic.     Right Ear: There is no impacted cerumen.     Left Ear: There is no impacted cerumen.     Nose: Nose normal.     Mouth/Throat:     Mouth: Mucous membranes are moist.  Eyes:     General:        Right eye: No discharge.        Left eye: No discharge.  Neck:     Thyroid: No thyroid mass or thyromegaly.  Cardiovascular:     Rate and Rhythm: Normal rate and regular rhythm.     Pulses: Normal pulses.     Heart sounds: Normal heart sounds.  Pulmonary:     Effort: Pulmonary effort is normal.     Breath sounds: Normal breath sounds.  Abdominal:     General: Bowel sounds are normal.     Palpations: Abdomen is soft.  Musculoskeletal:        General: Normal range of motion.     Cervical back: Neck supple.  Skin:    General: Skin is warm.     Capillary Refill: Capillary refill takes less than 2 seconds.  Neurological:     General: No focal deficit present.     Mental Status: She is alert and oriented to person, place, and time.  Psychiatric:        Mood and Affect: Mood normal.     Labs reviewed: Basic Metabolic Panel: No results for input(s): NA, K, CL, CO2, GLUCOSE, BUN, CREATININE, CALCIUM, MG, PHOS, TSH in the last 8760 hours. Liver Function Tests: No results for input(s): AST, ALT, ALKPHOS, BILITOT, PROT, ALBUMIN in the last 8760 hours. No results for input(s): LIPASE, AMYLASE in the last 8760 hours. No  results for input(s): AMMONIA in the last 8760 hours. CBC: No results for input(s): WBC, NEUTROABS, HGB, HCT, MCV, PLT in the last 8760 hours.  Lipid Panel: No results for input(s): CHOL, HDL, LDLCALC, TRIG, CHOLHDL, LDLDIRECT in the last 8760 hours. TSH: No results for input(s): TSH in the last 8760 hours. A1C: No results found for: HGBA1C   Assessment/Plan 1. Annual physical exam (Primary) - BMI 23.15 - BP controlled - will request last PAP information from gynecology - colonoscopy due at age 14 - CBC with Differential/Platelet; Future - Basic Metabolic Panel with eGFR; Future - Lipid Panel; Future  2. Encounter for screening mammogram for malignant neoplasm of breast - MM 3D SCREENING MAMMOGRAM BILATERAL BREAST  Total time: 31 minutes. Greater than 50% of total time spent doing patient education regarding health maintenance.    Next appt: Visit date not found  Lynnix Schoneman Gil BODILY  [facility] [phone]

## 2023-08-23 NOTE — Patient Instructions (Addendum)
 Dr. Ruthann Cover Silva> [facility]> [phone]

## 2023-08-28 ENCOUNTER — Encounter: Payer: Self-pay | Admitting: Orthopedic Surgery

## 2023-11-13 ENCOUNTER — Ambulatory Visit (INDEPENDENT_AMBULATORY_CARE_PROVIDER_SITE_OTHER): Payer: 59

## 2023-12-26 ENCOUNTER — Ambulatory Visit (INDEPENDENT_AMBULATORY_CARE_PROVIDER_SITE_OTHER): Admitting: Otolaryngology

## 2023-12-26 ENCOUNTER — Other Ambulatory Visit (HOSPITAL_BASED_OUTPATIENT_CLINIC_OR_DEPARTMENT_OTHER): Payer: Self-pay

## 2023-12-26 ENCOUNTER — Encounter (INDEPENDENT_AMBULATORY_CARE_PROVIDER_SITE_OTHER): Payer: Self-pay | Admitting: Otolaryngology

## 2023-12-26 VITALS — BP 110/68 | HR 77 | Ht 67.0 in | Wt 145.0 lb

## 2023-12-26 DIAGNOSIS — H938X3 Other specified disorders of ear, bilateral: Secondary | ICD-10-CM

## 2023-12-26 DIAGNOSIS — H608X3 Other otitis externa, bilateral: Secondary | ICD-10-CM

## 2023-12-26 DIAGNOSIS — J3089 Other allergic rhinitis: Secondary | ICD-10-CM

## 2023-12-26 DIAGNOSIS — H6993 Unspecified Eustachian tube disorder, bilateral: Secondary | ICD-10-CM | POA: Diagnosis not present

## 2023-12-26 DIAGNOSIS — H6121 Impacted cerumen, right ear: Secondary | ICD-10-CM

## 2023-12-26 MED ORDER — AZELASTINE HCL 0.1 % NA SOLN
2.0000 | Freq: Two times a day (BID) | NASAL | 12 refills | Status: AC
  Filled 2023-12-26: qty 30, 25d supply, fill #0
  Filled 2024-01-27: qty 30, 25d supply, fill #1
  Filled 2024-04-09: qty 30, 25d supply, fill #2
  Filled 2024-05-13: qty 30, 25d supply, fill #3

## 2023-12-26 MED ORDER — CIPROFLOXACIN-DEXAMETHASONE 0.3-0.1 % OT SUSP
4.0000 [drp] | Freq: Two times a day (BID) | OTIC | 1 refills | Status: AC
  Filled 2023-12-26: qty 7.5, 18d supply, fill #0

## 2023-12-26 MED ORDER — FLUTICASONE PROPIONATE 50 MCG/ACT NA SUSP
2.0000 | Freq: Two times a day (BID) | NASAL | 6 refills | Status: AC
  Filled 2023-12-26: qty 16, 30d supply, fill #0
  Filled 2024-01-27: qty 16, 30d supply, fill #1
  Filled 2024-04-09: qty 16, 30d supply, fill #2
  Filled 2024-05-13: qty 16, 30d supply, fill #3

## 2023-12-26 NOTE — Progress Notes (Signed)
 Dear Dr. Celina Colla, Here is my assessment for our mutual patient, April Juarez. Thank you for allowing me the opportunity to care for your patient. Please do not hesitate to contact me should you have any other questions. Sincerely, Dr. Milon Aloe  Otolaryngology Clinic Note Referring provider: Dr. Celina Colla HPI:  April Juarez is a 43 y.o. female kindly referred by Dr. Celina Colla for evaluation of multiple complaints: Initial visit (2024): Hearing loss - Usually feeling of fluid bilaterally, and some ear fullness. Fair amount of ear itchiness - lots of it, including currently. Some waxy drainage. No vertigo. No vestibular suppressants, barotrauma, no prior ear surgery. Irrigated prior and may have caused TM rupture? Derm diagnosed her with eczema around ears - steroid cream helped significantly with all sx. Did some ciprodex  ear drops - helped some as well. Recurrent sinusitis - Getting about 2-4 ear infections/year.  Coupled with sinus infections - 2-4/year. Lot of pressure (max, frontal), lightheaded, anterior rhinorrhea/PND (mostly PND; yellow/greenish), and congestion. Hyposmia during infection, but not otherwise. She feels like sinuses recover virtually completely afterwards.  Does get antibiotics for this - Augmentin  Jan; fall had Z-pak and steroid taper. It does help.   Currently uses claritin and flonase  as needed. Saline rinses PRN (Neti pot). Never tried astelin . No allergy testing or CT sinuses.   --------------------------------------------------------- 12/26/2023 She has been doing well overall, but she started to have some right ear fullness, and some muffled hearing over past few days. Was having sinus symptoms but they have resolved. Ear does not pop on right. Ears also continue to itch. Has not been using betamethasone  ointment. Doing claritin, using flonase  as needed. Using nasal rinses intermittently. Her symptoms seem consistent with what she has had in spring and fall in the  past. No sinus infections.  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 No pertinent past medical history.   Past Surgical History:  Procedure Laterality Date   DILATION AND CURETTAGE OF UTERUS  2008   TONSILLECTOMY AND ADENOIDECTOMY  2000    Family History  Problem Relation Age of Onset   Macular degeneration Mother    Dementia Maternal Grandmother    Cancer Maternal Grandmother    Stroke Maternal Grandfather    Heart attack Maternal Grandfather    Cancer Paternal Grandmother    Stroke Paternal Grandmother    Stroke Paternal Grandfather    Heart attack Paternal Grandfather    Breast cancer Neg Hx      Social Connections: Moderately Integrated (04/29/2023)   Social Connection and Isolation Panel [NHANES]    Frequency of Communication with Friends and Family: Three times a week    Frequency of Social Gatherings with Friends and Family: More than three times a week    Attends Religious Services: More than 4 times per year    Active Member of [employer] or Organizations: No    Attends Engineer, structural: Not on file    Marital Status: Married    Tobacco: denies  Current Outpatient Medications:    Azelaic Acid  15 %  gel, Apply to face daily, Disp: 50 g, Rfl: 2   azelastine  (ASTELIN ) 0.1 % nasal spray, Place 2 sprays into both nostrils 2 (two) times daily., Disp: 30 mL, Rfl: 12   betamethasone  dipropionate 0.05 % cream, Apply  topically to outside of ear (concha bowl) twice per day for 7 days in case of ear itching, Disp: 45 g, Rfl: 0   ciprofloxacin -dexamethasone  (CIPRODEX ) OTIC suspension, Place 4 drops into the right ear 2 (two) times daily for 10 days., Disp: 7.5 mL, Rfl: 1   IBUPROFEN PO, Take by mouth. 600-800 mg 1 to 2 times a week, Disp: , Rfl:    Loratadine (CLARITIN) 10 MG CAPS, Take 10 mg by mouth daily., Disp: , Rfl:    Multiple Vitamins-Minerals (WOMENS MULTIVITAMIN PO), Take by mouth daily., Disp: , Rfl:    Omega-3 Fatty Acids (FISH OIL PO), Take by mouth daily., Disp: , Rfl:    Probiotic Product (PROBIOTIC-10 PO), Probiotic  1 TAB, DAILY, Disp: , Rfl:    spironolactone  (ALDACTONE ) 25 MG tablet, Take 1 tablet (25 mg total) by mouth 2 (two) times daily., Disp: 60 tablet, Rfl: 6   tretinoin  (RETIN-A ) 0.05 % cream, Apply to face 3 nights a week, increasing to nightly as tolerated, Disp: 45 g, Rfl: 3   VITAMIN D, CHOLECALCIFEROL, PO, Take by mouth., Disp: , Rfl:    VTAMA 1 % CREA, Apply 1 % topically 3 times/day as needed-between meals & bedtime., Disp: , Rfl:    fluticasone  (FLONASE ) 50 MCG/ACT nasal spray, Place 2 sprays into both nostrils in the morning and at bedtime., Disp: 16 g, Rfl: 6   spironolactone  (ALDACTONE ) 25 MG tablet, Take 1 tablet (25 mg total) by mouth daily for 14 days, THEN 2 tablets (50 mg total) daily., Disp: 60 tablet, Rfl: 4   Physical Exam:   BP 110/68 (BP Location: Right Arm, Patient Position: Sitting, Cuff Size: Normal)   Pulse 77   Ht 5' 7 (1.702 m)   Wt 145 lb (65.8 kg)   SpO2 98%   BMI 22.71 kg/m    Salient findings:  CN II-XII intact Given history and complaints, ear microscopy was indicated and performed for evaluation with findings as below in physical exam section and in procedures; Right ear ceruminous debris impaction; after removal, Bilateral EAC clear and TM intact with well pneumatized middle ear spaces; ear canals cartilaginous very narrow; unable to visualize  anteriormost part of TM but otherwise well aerated, no drainage, no perforation; modest b/l eczematoid changes Weber 512: midline;  [place] 512: AC > BC b/l Anterior rhinoscopy: Septum modestly deviation; bilateral inferior turbinates with mild hypertrophy; <22mm papillomatous lesion left nasal sill, stable No lesions of oral cavity/oropharynx; dentition fair No obviously palpable neck masses/lymphadenopathy/thyromegaly No respiratory distress or stridor  Procedures:  Procedure: Bilateral ear microscopy and cerumen removal using microscope (CPT [phone]) - Mod 25 Pre-procedure diagnosis: Cerumen impaction right external ears Post-procedure diagnosis: same Indication: Right cerumen impaction; given patient's otologic complaints and history as well as for improved and comprehensive examination of external ear and tympanic membrane, bilateral otologic examination using microscope was performed and impacted cerumen removed  Procedure: Patient was placed semi-recumbent. Both ear canals were examined using the microscope with findings above. Impacted Cerumen removed on right using suction and currette with improvement in EAC examination and patency. Patient tolerated the procedure well.  CPT CODE -- [id number] - Mod 25   Impression & Plans:  April Juarez is a 43 y.o. female with multiple  complaints:  1. Sensation of fullness in both ears   2. Chronic eczematous otitis externa of both ears   3. Non-seasonal allergic rhinitis, unspecified trigger   4. Dysfunction of both eustachian tubes   5. Impacted cerumen of right ear    Bilateral ear fullness and itching: most likely related to eczematoid OE. Has seen derm for this as well - Given current exacerbations, will restart bethamethasone ointment BID x14d and ciprodex  (see below) - Prior audio wnl essentially, which is reassuring  2.  Bilateral recurrent acute sinusitis and ETD - 2-4/year, improved with abx. No recent exacerbation - Given ear sx,  suspect allergies and ETD playing a role. We will restart her flonase  and add astelin  BID; continue daily rinses; prior endo reassuring  F/u 1 year  Meds ordered this encounter  Medications   ciprofloxacin -dexamethasone  (CIPRODEX ) OTIC suspension    Sig: Place 4 drops into the right ear 2 (two) times daily for 10 days.    Dispense:  7.5 mL    Refill:  1   fluticasone  (FLONASE ) 50 MCG/ACT nasal spray    Sig: Place 2 sprays into both nostrils in the morning and at bedtime.    Dispense:  16 g    Refill:  6   azelastine  (ASTELIN ) 0.1 % nasal spray    Sig: Place 2 sprays into both nostrils 2 (two) times daily.    Dispense:  30 mL    Refill:  12     Thank you for allowing me the opportunity to care for your patient. Please do not hesitate to contact me should you have any other questions.  Sincerely, Milon Aloe, MD Otolaryngologist (ENT), [facility] Phone: [phone] Fax: [phone]  12/26/2023, 9:08 AM   MDM:  Level 4: 99214 Complexity/Problems addressed: mod - chronic problems, exacerbation Data complexity: low - Morbidity: mod - Prescription Drug prescribed or managed: yes

## 2023-12-26 NOTE — Patient Instructions (Signed)
   Use two sprays of flonase  in each nostril twice per day  right after, use astelin  spray two sprays each nostril twice per day  Use ciprodex  drops right ear 4 drops twice per day; use the betamethasone  ointment outside both ear canals twice per day - do this for 2 weeks.   Andres Bangs Med Nasal Saline Rinse - use daily; stop 3 days before CT scan - start nasal saline rinses with NeilMed Bottle available over the counter    Nasal Saline Irrigation instructions: If you choose to make your own salt water solution, You will need: Salt (kosher, canning, or pickling salt) Baking soda Nasal irrigation bottle (i.e. Andres Bangs Med Sinus Rinse) Measuring spoon ( teaspoon) Distilled / boiled water   Mix solution Mix 1 teaspoon of salt, 1/2 teaspoon of baking soda and 1 cup of water into irrigation bottle ** May use saline packet instead of homemade recipe for this step if you prefer If medicine was prescribed to be mixed with solution, place this into bottle Examples 2 inches of 2% mupirocin ointment Budesonide solution Position your head: Lean over sink (about 45 degrees) Rotate head (about 45 degrees) so that one nostril is above the other Irrigate Insert tip of irrigation bottle into upper nostril so it forms a comfortable seal Irrigate while breathing through your mouth May remove the straw from the bottle in order to irrigate the entire solution (important if medicine was added) Exhale through nose when finished and blow nose as necessary  Repeat on opposite side with other 1/2 of solution (120 mL) or remake solution if all 240 mL was used on first side Wash irrigation bottle regularly, replace every 3 months

## 2024-01-01 ENCOUNTER — Other Ambulatory Visit (HOSPITAL_BASED_OUTPATIENT_CLINIC_OR_DEPARTMENT_OTHER): Payer: Self-pay

## 2024-01-01 DIAGNOSIS — L400 Psoriasis vulgaris: Secondary | ICD-10-CM | POA: Diagnosis not present

## 2024-01-01 DIAGNOSIS — D485 Neoplasm of uncertain behavior of skin: Secondary | ICD-10-CM | POA: Diagnosis not present

## 2024-01-01 DIAGNOSIS — L700 Acne vulgaris: Secondary | ICD-10-CM | POA: Diagnosis not present

## 2024-01-01 MED ORDER — SPIRONOLACTONE 25 MG PO TABS
25.0000 mg | ORAL_TABLET | Freq: Two times a day (BID) | ORAL | 6 refills | Status: AC
  Filled 2024-04-09: qty 60, 30d supply, fill #0
  Filled 2024-05-13: qty 60, 30d supply, fill #1
  Filled 2024-06-19: qty 60, 30d supply, fill #2
  Filled 2024-08-06: qty 60, 30d supply, fill #3

## 2024-01-01 MED ORDER — AZELAIC ACID 15 % EX GEL
1.0000 | Freq: Every day | CUTANEOUS | 5 refills | Status: AC
  Filled 2024-01-01: qty 50, 30d supply, fill #0
  Filled 2024-01-27: qty 50, 30d supply, fill #1
  Filled 2024-05-13: qty 50, 30d supply, fill #2

## 2024-01-01 MED ORDER — TRETINOIN 0.05 % EX CREA
1.0000 | TOPICAL_CREAM | Freq: Every day | CUTANEOUS | 5 refills | Status: AC
  Filled 2024-01-01: qty 45, 30d supply, fill #0
  Filled 2024-01-27: qty 45, 30d supply, fill #1
  Filled 2024-05-13: qty 45, 30d supply, fill #2

## 2024-01-01 MED ORDER — DAPSONE 5 % EX GEL
1.0000 | Freq: Every morning | CUTANEOUS | 2 refills | Status: AC
  Filled 2024-01-01: qty 60, 60d supply, fill #0
  Filled 2024-05-13: qty 60, 60d supply, fill #1

## 2024-01-02 ENCOUNTER — Other Ambulatory Visit (HOSPITAL_BASED_OUTPATIENT_CLINIC_OR_DEPARTMENT_OTHER): Payer: Self-pay

## 2024-02-28 ENCOUNTER — Other Ambulatory Visit (HOSPITAL_BASED_OUTPATIENT_CLINIC_OR_DEPARTMENT_OTHER): Payer: Self-pay

## 2024-02-28 ENCOUNTER — Other Ambulatory Visit: Payer: Self-pay

## 2024-04-09 ENCOUNTER — Other Ambulatory Visit: Payer: Self-pay

## 2024-04-09 ENCOUNTER — Other Ambulatory Visit (HOSPITAL_BASED_OUTPATIENT_CLINIC_OR_DEPARTMENT_OTHER): Payer: Self-pay

## 2024-04-17 DIAGNOSIS — Z130 Encounter for screening for diseases of the blood and blood-forming organs and certain disorders involving the immune mechanism: Secondary | ICD-10-CM | POA: Diagnosis not present

## 2024-04-17 DIAGNOSIS — Z1231 Encounter for screening mammogram for malignant neoplasm of breast: Secondary | ICD-10-CM | POA: Diagnosis not present

## 2024-04-17 DIAGNOSIS — N926 Irregular menstruation, unspecified: Secondary | ICD-10-CM | POA: Diagnosis not present

## 2024-04-17 DIAGNOSIS — Z01419 Encounter for gynecological examination (general) (routine) without abnormal findings: Secondary | ICD-10-CM | POA: Diagnosis not present

## 2024-04-17 DIAGNOSIS — N939 Abnormal uterine and vaginal bleeding, unspecified: Secondary | ICD-10-CM | POA: Diagnosis not present

## 2024-05-14 ENCOUNTER — Other Ambulatory Visit (HOSPITAL_BASED_OUTPATIENT_CLINIC_OR_DEPARTMENT_OTHER): Payer: Self-pay

## 2024-05-22 ENCOUNTER — Other Ambulatory Visit (HOSPITAL_BASED_OUTPATIENT_CLINIC_OR_DEPARTMENT_OTHER): Payer: Self-pay

## 2024-05-22 DIAGNOSIS — N939 Abnormal uterine and vaginal bleeding, unspecified: Secondary | ICD-10-CM | POA: Diagnosis not present

## 2024-05-29 DIAGNOSIS — N939 Abnormal uterine and vaginal bleeding, unspecified: Secondary | ICD-10-CM | POA: Diagnosis not present

## 2024-06-19 ENCOUNTER — Telehealth (INDEPENDENT_AMBULATORY_CARE_PROVIDER_SITE_OTHER): Payer: Self-pay

## 2024-06-19 NOTE — Telephone Encounter (Signed)
 Patient stated she is having issues with her ears again and would like a refill on the steroid medication Dr. Tobie has prescribed to her in the past. Patient asked for a call back.   Voicemail was left on 06/19/2024 at 12:30 PM

## 2024-06-20 NOTE — Telephone Encounter (Signed)
 Prescription called into patient's [facility]. Left a voicemail informing patient

## 2024-07-21 ENCOUNTER — Telehealth (INDEPENDENT_AMBULATORY_CARE_PROVIDER_SITE_OTHER): Payer: Self-pay | Admitting: Otolaryngology

## 2024-07-21 NOTE — Telephone Encounter (Signed)
 Called patient to see if she wanted to move her 08/19/2024 appointment to 07/21/2024.  She declined.

## 2024-07-24 ENCOUNTER — Telehealth (INDEPENDENT_AMBULATORY_CARE_PROVIDER_SITE_OTHER): Payer: Self-pay | Admitting: Otolaryngology

## 2024-07-24 ENCOUNTER — Other Ambulatory Visit (HOSPITAL_BASED_OUTPATIENT_CLINIC_OR_DEPARTMENT_OTHER): Payer: Self-pay

## 2024-07-24 MED ORDER — CIPROFLOXACIN-DEXAMETHASONE 0.3-0.1 % OT SUSP
4.0000 [drp] | Freq: Two times a day (BID) | OTIC | 1 refills | Status: DC
  Filled 2024-07-24: qty 7.5, 14d supply, fill #0
  Filled 2024-08-07: qty 7.5, 14d supply, fill #1

## 2024-07-24 NOTE — Telephone Encounter (Signed)
 Informed patient of provider's recommendations and that he sent in the r/x to her pharmacy. Patient verbalizes understanding.

## 2024-07-24 NOTE — Telephone Encounter (Signed)
 eRx ciprodex  x14d; call if no improvement in 1 week April Juarez

## 2024-08-05 ENCOUNTER — Telehealth (INDEPENDENT_AMBULATORY_CARE_PROVIDER_SITE_OTHER): Payer: Self-pay

## 2024-08-05 NOTE — Telephone Encounter (Signed)
 Patient called call was answered. Patient stated the ear drops that Dr. Tobie prescribed works for some of the symptoms the drainage how ever is worse and now she is experiencing no hearing now. Please advise

## 2024-08-05 NOTE — Telephone Encounter (Signed)
 Got her added on for Thursday.

## 2024-08-07 ENCOUNTER — Ambulatory Visit (INDEPENDENT_AMBULATORY_CARE_PROVIDER_SITE_OTHER): Admitting: Otolaryngology

## 2024-08-07 ENCOUNTER — Other Ambulatory Visit (HOSPITAL_BASED_OUTPATIENT_CLINIC_OR_DEPARTMENT_OTHER): Payer: Self-pay

## 2024-08-07 ENCOUNTER — Encounter (INDEPENDENT_AMBULATORY_CARE_PROVIDER_SITE_OTHER): Payer: Self-pay | Admitting: Otolaryngology

## 2024-08-07 VITALS — BP 118/71 | HR 66 | Ht 67.0 in | Wt 145.0 lb

## 2024-08-07 DIAGNOSIS — H608X3 Other otitis externa, bilateral: Secondary | ICD-10-CM | POA: Diagnosis not present

## 2024-08-07 DIAGNOSIS — H6993 Unspecified Eustachian tube disorder, bilateral: Secondary | ICD-10-CM

## 2024-08-07 DIAGNOSIS — H6642 Suppurative otitis media, unspecified, left ear: Secondary | ICD-10-CM

## 2024-08-07 DIAGNOSIS — H6122 Impacted cerumen, left ear: Secondary | ICD-10-CM

## 2024-08-07 DIAGNOSIS — H7292 Unspecified perforation of tympanic membrane, left ear: Secondary | ICD-10-CM

## 2024-08-07 DIAGNOSIS — J3089 Other allergic rhinitis: Secondary | ICD-10-CM | POA: Diagnosis not present

## 2024-08-07 MED ORDER — AMOXICILLIN-POT CLAVULANATE 875-125 MG PO TABS
1.0000 | ORAL_TABLET | Freq: Two times a day (BID) | ORAL | 0 refills | Status: AC
  Filled 2024-08-07: qty 20, 10d supply, fill #0

## 2024-08-07 NOTE — Patient Instructions (Signed)
 Take Augmentin  875 mg by mouth (PO) twice daily for 10 days; take with food, take probiotic or yogurt with it Use ciprodex  ear drops 4 drops twice daily on the wick until you see us  back Keep ear dry -- cotton ball coated in vaseline when showering

## 2024-08-07 NOTE — Progress Notes (Signed)
 Dear Dr. Gil, Here is my assessment for our mutual patient, April Juarez. Thank you for allowing me the opportunity to care for your patient. Please do not hesitate to contact me should you have any other questions. Sincerely, Dr. Eldora Blanch  Otolaryngology Clinic Note Referring provider: Dr. Gil HPI:  April Juarez is a 44 y.o. female kindly referred by Dr. Gil for evaluation of multiple complaints: Initial visit (2024): Hearing loss - Usually feeling of fluid bilaterally, and some ear fullness. Fair amount of ear itchiness - lots of it, including currently. Some waxy drainage. No vertigo. No vestibular suppressants, barotrauma, no prior ear surgery. Irrigated prior and may have caused TM rupture? Derm diagnosed her with eczema around ears - steroid cream helped significantly with all sx. Did some ciprodex  ear drops - helped some as well. Recurrent sinusitis - Getting about 2-4 ear infections/year.  Coupled with sinus infections - 2-4/year. Lot of pressure (max, frontal), lightheaded, anterior rhinorrhea/PND (mostly PND; yellow/greenish), and congestion. Hyposmia during infection, but not otherwise. She feels like sinuses recover virtually completely afterwards.  Does get antibiotics for this - Augmentin  Jan; fall had Z-pak and steroid taper. It does help.   Currently uses claritin and flonase  as needed. Saline rinses PRN (Neti pot). Never tried astelin . No allergy testing or CT sinuses.   --------------------------------------------------------- 12/26/2023 She has been doing well overall, but she started to have some right ear fullness, and some muffled hearing over past few days. Was having sinus symptoms but they have resolved. Ear does not pop on right. Ears also continue to itch. Has not been using betamethasone  ointment. Doing claritin, using flonase  as needed. Using nasal rinses intermittently. Her symptoms seem consistent with what she has had in spring and fall in the  past. No sinus infections.  --------------------------------------------------------- 08/07/2024 Having issues with ears. She felt like she had a ruptured ear drum after flying when she heard a pop (back in middle of August) but then was getting better (did have drainage that she used ciprodex  drops). Most recently then had another exacerbation in December for which she again used ciprodex  which helped but then started to have left ear drainage about 2 weeks ago with significant muffling and fullness but no pressure or pain. No sinus issues. Using rinses and flonase  fairly regularly.   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 No pertinent past medical history.   Past Surgical History:  Procedure Laterality Date   DILATION AND CURETTAGE OF UTERUS  2008   TONSILLECTOMY AND ADENOIDECTOMY  2000    Family History  Problem Relation Age of Onset   Macular degeneration Mother    Dementia Maternal Grandmother    Cancer Maternal Grandmother    Stroke Maternal Grandfather    Heart attack Maternal Grandfather    Cancer Paternal Grandmother    Stroke Paternal Grandmother    Stroke Paternal Grandfather    Heart attack Paternal Grandfather    Breast cancer Neg Hx      Social Connections: Moderately Integrated (04/29/2023)   Social Connection and Isolation Panel  Frequency of Communication with Friends and Family: Three times a week    Frequency of Social Gatherings with Friends and Family: More than three  times a week    Attends Religious Services: More than 4 times per year    Active Member of Clubs or Organizations: No    Attends Engineer, Structural: Not on file    Marital Status: Married    Tobacco: denies  Current Outpatient Medications:    amoxicillin -clavulanate (AUGMENTIN ) 875-125 MG tablet, Take 1 tablet by mouth 2 (two) times daily for 10 days., Disp: 20 tablet, Rfl: 0   Azelaic Acid  15 % gel, Apply to face daily, Disp: 50 g, Rfl: 2   Azelaic Acid  15 % gel, Apply 1 Application topically to face daily., Disp: 50 g, Rfl: 5   azelastine  (ASTELIN ) 0.1 % nasal spray, Place 2 sprays into both nostrils 2 (two) times daily., Disp: 30 mL, Rfl: 12   betamethasone  dipropionate 0.05 % cream, Apply topically to outside of ear (concha bowl) twice per day for 7 days in case of ear itching, Disp: 45 g, Rfl: 0   ciprofloxacin -dexamethasone  (CIPRODEX ) OTIC suspension, Place 4 drops into the left ear 2 (two) times daily for 14 days., Disp: 7.5 mL, Rfl: 1   Dapsone  5 % topical gel, Apply 1 Application topically to the face every morning., Disp: 60 g, Rfl: 2   fluticasone  (FLONASE ) 50 MCG/ACT nasal spray, Place 2 sprays into both nostrils in the morning and at bedtime., Disp: 16 g, Rfl: 6   IBUPROFEN PO, Take by mouth. 600-800 mg 1 to 2 times a week, Disp: , Rfl:    Loratadine (CLARITIN) 10 MG CAPS, Take 10 mg by mouth daily., Disp: , Rfl:    Multiple Vitamins-Minerals (WOMENS MULTIVITAMIN PO), Take by mouth daily., Disp: , Rfl:    Omega-3 Fatty Acids (FISH OIL PO), Take by mouth daily., Disp: , Rfl:    Probiotic Product (PROBIOTIC-10 PO), Probiotic  1 TAB, DAILY, Disp: , Rfl:    spironolactone  (ALDACTONE ) 25 MG tablet, Take 1 tablet (25 mg total) by mouth daily for 14 days, THEN 2 tablets (50 mg total) daily., Disp: 60 tablet, Rfl: 4   spironolactone  (ALDACTONE ) 25 MG tablet, Take 1 tablet (25 mg total) by mouth 2 (two) times daily., Disp: 60 tablet, Rfl: 6   spironolactone  (ALDACTONE ) 25 MG  tablet, Take 1 tablet (25 mg total) by mouth up to 2 (two) times daily., Disp: 60 tablet, Rfl: 6   tretinoin  (RETIN-A ) 0.05 % cream, Apply to face 3 nights a week, increasing to nightly as tolerated, Disp: 45 g, Rfl: 3   tretinoin  (RETIN-A ) 0.05 % cream, Apply 1 Application topically at bedtime., Disp: 45 g, Rfl: 5   VITAMIN D, CHOLECALCIFEROL, PO, Take by mouth., Disp: , Rfl:    VTAMA 1 % CREA, Apply 1 % topically 3 times/day as needed-between meals & bedtime., Disp: , Rfl:    Physical Exam:   BP 118/71 (BP Location: Right Arm, Patient Position: Sitting, Cuff Size: Normal)   Pulse 66   Ht 5' 7 (1.702 m)   Wt 145 lb (65.8 kg)   SpO2 96%   BMI 22.71 kg/m    Salient findings:  CN II-XII intact Given history and complaints, ear microscopy was indicated and performed for evaluation with findings as below in physical exam section and in procedures; Right EAC clear and TM intact with well pneumatized middle ear spaces; ear canals cartilaginous very narrow; unable to visualize anteriormost  part of TM but otherwise well aerated, no drainage, no perforation; modest b/l eczematoid changes On left, ceruminous debris and purulence which is impacted on; there is an approx 20% perforation inferiorly which is apparent; after clearance, CSF powder applied, two otowicks placed and hydrated with ciprodex  Anterior rhinoscopy: Septum modestly deviation; bilateral inferior turbinates with mild hypertrophy; <94mm papillomatous lesion left nasal sill, stable No lesions of oral cavity/oropharynx; dentition fair No obviously palpable neck masses/lymphadenopathy/thyromegaly No respiratory distress or stridor  Procedures:  Procedure: Bilateral ear microscopy and cerumen removal using microscope (CPT [phone]) - Mod 25 Pre-procedure diagnosis: Cerumen impaction left external ears Post-procedure diagnosis: same Indication: Left cerumen impaction; given patient's otologic complaints and history as well as for improved  and comprehensive examination of external ear and tympanic membrane, bilateral otologic examination using microscope was performed and impacted cerumen removed Prior to procedure, R/B/A discussed and verbal consent obtained  Procedure: Patient was placed semi-recumbent. Both ear canals were examined using the microscope with findings above. Impacted Cerumen and debris removed on left to clean ear using suction and currette with improvement in EAC examination and patency. Patient tolerated the procedure well.     Impression & Plans:  Tiffanny Lamarche is a 44 y.o. female with multiple complaints:  1. Chronic eczematous otitis externa of both ears   2. Non-seasonal allergic rhinitis, unspecified trigger   3. Dysfunction of both eustachian tubes   4. Impacted cerumen of left ear   5. Suppurative otitis media of left ear, unspecified chronicity   6. Perforation of left tympanic membrane    Bilateral ear fullness and itching: most likely related to eczematoid OE; doing well from this standpoint  2. Now with left suppurative OM and TM perforation (wonder if this occurred in fall) -- discussed safe/dry ear; CSF powder placed, ciprodex  drops and wicks placed; will do Augmentin  BID and start ciprodex  in 3 days and continue; f/u in ~10 days - Prior audio wnl   3.  Bilateral recurrent acute sinusitis and ETD - 2-4/year, improved with abx. No recent exacerbation - Given ear sx, suspect allergies and ETD playing a role. Continue flonase  and rinses  Meds ordered this encounter  Medications   amoxicillin -clavulanate (AUGMENTIN ) 875-125 MG tablet    Sig: Take 1 tablet by mouth 2 (two) times daily for 10 days.    Dispense:  20 tablet    Refill:  0     Thank you for allowing me the opportunity to care for your patient. Please do not hesitate to contact me should you have any other questions.  Sincerely, Eldora Blanch, MD Otolaryngologist (ENT), [facility] Phone:  [phone] Fax: [phone]  08/07/2024, 9:36 AM   MDM:  Level 4: 99214 Complexity/Problems addressed: mod - chronic problem, one with exacerbation Data complexity: low - Morbidity: mod - Prescription Drug prescribed or managed: yes

## 2024-08-08 ENCOUNTER — Telehealth (INDEPENDENT_AMBULATORY_CARE_PROVIDER_SITE_OTHER): Payer: Self-pay

## 2024-08-08 NOTE — Telephone Encounter (Signed)
 Patient left message at 9:05am. She stated that the ear wick has come out of her ear. Her ear is wet and has drainage. Does she need to be seen today? She was seen yesterday by Tobie.

## 2024-08-12 NOTE — Telephone Encounter (Signed)
 Left a voicemail informing the patient of provider's recommendations.

## 2024-08-18 ENCOUNTER — Telehealth: Payer: Self-pay | Admitting: Otolaryngology

## 2024-08-18 NOTE — Telephone Encounter (Signed)
 Called 08-18-24 to r/s due to weather delay on 08-19-24, lvmail for pt to call back to r/s

## 2024-08-19 ENCOUNTER — Encounter (INDEPENDENT_AMBULATORY_CARE_PROVIDER_SITE_OTHER): Payer: Self-pay | Admitting: Otolaryngology

## 2024-08-19 ENCOUNTER — Ambulatory Visit (INDEPENDENT_AMBULATORY_CARE_PROVIDER_SITE_OTHER): Admitting: Otolaryngology

## 2024-08-19 ENCOUNTER — Telehealth (INDEPENDENT_AMBULATORY_CARE_PROVIDER_SITE_OTHER): Payer: Self-pay

## 2024-08-19 ENCOUNTER — Other Ambulatory Visit (HOSPITAL_BASED_OUTPATIENT_CLINIC_OR_DEPARTMENT_OTHER): Payer: Self-pay

## 2024-08-19 VITALS — BP 112/74 | HR 66 | Temp 97.9°F

## 2024-08-19 DIAGNOSIS — H608X3 Other otitis externa, bilateral: Secondary | ICD-10-CM

## 2024-08-19 DIAGNOSIS — H6993 Unspecified Eustachian tube disorder, bilateral: Secondary | ICD-10-CM | POA: Diagnosis not present

## 2024-08-19 DIAGNOSIS — J3089 Other allergic rhinitis: Secondary | ICD-10-CM

## 2024-08-19 DIAGNOSIS — H7292 Unspecified perforation of tympanic membrane, left ear: Secondary | ICD-10-CM | POA: Diagnosis not present

## 2024-08-19 DIAGNOSIS — H6642 Suppurative otitis media, unspecified, left ear: Secondary | ICD-10-CM

## 2024-08-19 MED ORDER — CIPROFLOXACIN-DEXAMETHASONE 0.3-0.1 % OT SUSP
4.0000 [drp] | Freq: Two times a day (BID) | OTIC | 1 refills | Status: AC
  Filled 2024-08-19: qty 7.5, 19d supply, fill #0

## 2024-08-28 ENCOUNTER — Encounter: Payer: Commercial Managed Care - PPO | Admitting: Orthopedic Surgery

## 2024-09-10 ENCOUNTER — Ambulatory Visit (INDEPENDENT_AMBULATORY_CARE_PROVIDER_SITE_OTHER): Admitting: Otolaryngology

## 2024-09-11 ENCOUNTER — Ambulatory Visit (INDEPENDENT_AMBULATORY_CARE_PROVIDER_SITE_OTHER): Admitting: Otolaryngology

## 2024-10-06 ENCOUNTER — Ambulatory Visit (INDEPENDENT_AMBULATORY_CARE_PROVIDER_SITE_OTHER): Admitting: Otolaryngology
